# Patient Record
Sex: Female | Born: 1975 | Race: Black or African American | Hispanic: No | Marital: Single | Smoking: Current every day smoker
Health system: Southern US, Community
[De-identification: ages and names within clinical notes are randomized; demographics above are authoritative.]

## PROBLEM LIST (undated history)

## (undated) DIAGNOSIS — G8929 Other chronic pain: Secondary | ICD-10-CM

## (undated) DIAGNOSIS — E119 Type 2 diabetes mellitus without complications: Secondary | ICD-10-CM

## (undated) DIAGNOSIS — K219 Gastro-esophageal reflux disease without esophagitis: Secondary | ICD-10-CM

## (undated) DIAGNOSIS — M1990 Unspecified osteoarthritis, unspecified site: Secondary | ICD-10-CM

## (undated) DIAGNOSIS — M549 Dorsalgia, unspecified: Secondary | ICD-10-CM

## (undated) HISTORY — PX: CARPAL TUNNEL RELEASE: SHX101

## (undated) HISTORY — PX: KNEE SURGERY: SHX244

## (undated) HISTORY — PX: TUBAL LIGATION: SHX77

---

## 2005-09-24 ENCOUNTER — Ambulatory Visit (HOSPITAL_COMMUNITY): Admission: RE | Admit: 2005-09-24 | Discharge: 2005-09-24 | Payer: Self-pay | Admitting: Family Medicine

## 2005-10-05 ENCOUNTER — Emergency Department (HOSPITAL_COMMUNITY): Admission: EM | Admit: 2005-10-05 | Discharge: 2005-10-05 | Payer: Self-pay | Admitting: Emergency Medicine

## 2005-10-08 ENCOUNTER — Ambulatory Visit (HOSPITAL_COMMUNITY): Admission: RE | Admit: 2005-10-08 | Discharge: 2005-10-08 | Payer: Self-pay | Admitting: Family Medicine

## 2005-11-14 ENCOUNTER — Ambulatory Visit (HOSPITAL_COMMUNITY): Admission: AD | Admit: 2005-11-14 | Discharge: 2005-11-14 | Payer: Self-pay | Admitting: Obstetrics and Gynecology

## 2005-12-16 ENCOUNTER — Ambulatory Visit (HOSPITAL_COMMUNITY): Admission: AD | Admit: 2005-12-16 | Discharge: 2005-12-16 | Payer: Self-pay | Admitting: Obstetrics and Gynecology

## 2005-12-18 ENCOUNTER — Encounter: Payer: Self-pay | Admitting: Obstetrics and Gynecology

## 2005-12-18 ENCOUNTER — Inpatient Hospital Stay (HOSPITAL_COMMUNITY): Admission: AD | Admit: 2005-12-18 | Discharge: 2005-12-20 | Payer: Self-pay | Admitting: Obstetrics and Gynecology

## 2006-01-24 ENCOUNTER — Ambulatory Visit (HOSPITAL_COMMUNITY): Admission: RE | Admit: 2006-01-24 | Discharge: 2006-01-24 | Payer: Self-pay | Admitting: Obstetrics and Gynecology

## 2006-03-15 ENCOUNTER — Emergency Department (HOSPITAL_COMMUNITY): Admission: EM | Admit: 2006-03-15 | Discharge: 2006-03-15 | Payer: Self-pay | Admitting: Emergency Medicine

## 2006-10-18 DIAGNOSIS — G8929 Other chronic pain: Secondary | ICD-10-CM

## 2006-10-18 HISTORY — DX: Other chronic pain: G89.29

## 2008-07-01 ENCOUNTER — Encounter: Payer: Self-pay | Admitting: Family Medicine

## 2008-07-18 ENCOUNTER — Encounter: Payer: Self-pay | Admitting: Family Medicine

## 2008-09-26 ENCOUNTER — Emergency Department (HOSPITAL_COMMUNITY): Admission: EM | Admit: 2008-09-26 | Discharge: 2008-09-26 | Payer: Self-pay | Admitting: Emergency Medicine

## 2008-10-08 ENCOUNTER — Emergency Department (HOSPITAL_COMMUNITY): Admission: EM | Admit: 2008-10-08 | Discharge: 2008-10-08 | Payer: Self-pay | Admitting: Emergency Medicine

## 2008-10-16 ENCOUNTER — Ambulatory Visit (HOSPITAL_COMMUNITY): Admission: RE | Admit: 2008-10-16 | Discharge: 2008-10-16 | Payer: Self-pay | Admitting: Orthopaedic Surgery

## 2009-04-21 ENCOUNTER — Emergency Department (HOSPITAL_COMMUNITY): Admission: EM | Admit: 2009-04-21 | Discharge: 2009-04-21 | Payer: Self-pay | Admitting: Emergency Medicine

## 2009-05-18 ENCOUNTER — Emergency Department (HOSPITAL_COMMUNITY): Admission: EM | Admit: 2009-05-18 | Discharge: 2009-05-18 | Payer: Self-pay | Admitting: Emergency Medicine

## 2009-06-26 ENCOUNTER — Ambulatory Visit (HOSPITAL_COMMUNITY): Admission: RE | Admit: 2009-06-26 | Discharge: 2009-06-26 | Payer: Self-pay | Admitting: Orthopaedic Surgery

## 2009-11-20 ENCOUNTER — Emergency Department (HOSPITAL_COMMUNITY): Admission: EM | Admit: 2009-11-20 | Discharge: 2009-11-20 | Payer: Self-pay | Admitting: Emergency Medicine

## 2010-03-28 ENCOUNTER — Emergency Department (HOSPITAL_COMMUNITY): Admission: EM | Admit: 2010-03-28 | Discharge: 2010-03-28 | Payer: Self-pay | Admitting: Emergency Medicine

## 2010-04-27 ENCOUNTER — Emergency Department (HOSPITAL_COMMUNITY): Admission: EM | Admit: 2010-04-27 | Discharge: 2010-04-27 | Payer: Self-pay | Admitting: Emergency Medicine

## 2011-01-19 ENCOUNTER — Emergency Department (HOSPITAL_COMMUNITY): Payer: Self-pay

## 2011-01-19 ENCOUNTER — Emergency Department (HOSPITAL_COMMUNITY)
Admission: EM | Admit: 2011-01-19 | Discharge: 2011-01-19 | Disposition: A | Discharge status: Home / Self Care | Payer: Self-pay | Attending: Emergency Medicine | Admitting: Emergency Medicine

## 2011-01-19 DIAGNOSIS — J45909 Unspecified asthma, uncomplicated: Secondary | ICD-10-CM | POA: Insufficient documentation

## 2011-01-19 DIAGNOSIS — M25569 Pain in unspecified knee: Secondary | ICD-10-CM | POA: Insufficient documentation

## 2011-01-19 DIAGNOSIS — G8929 Other chronic pain: Secondary | ICD-10-CM | POA: Insufficient documentation

## 2011-01-22 LAB — DIFFERENTIAL
Eosinophils Relative: 4 % (ref 0–5)
Lymphocytes Relative: 21 % (ref 12–46)
Lymphs Abs: 2.4 10*3/uL (ref 0.7–4.0)
Monocytes Absolute: 0.8 10*3/uL (ref 0.1–1.0)

## 2011-01-22 LAB — COMPREHENSIVE METABOLIC PANEL
AST: 24 U/L (ref 0–37)
Albumin: 3.8 g/dL (ref 3.5–5.2)
Calcium: 9.5 mg/dL (ref 8.4–10.5)
Chloride: 102 mEq/L (ref 96–112)
Creatinine, Ser: 0.6 mg/dL (ref 0.4–1.2)
GFR calc Af Amer: >60 mL/min (ref >60)
Total Protein: 6.8 g/dL (ref 6.0–8.3)

## 2011-01-22 LAB — CBC
MCHC: 34.2 g/dL (ref 30.0–36.0)
MCV: 84.3 fL (ref 78.0–100.0)
Platelets: 199 10*3/uL (ref 150–400)
RDW: 14.5 % (ref 11.5–15.5)
WBC: 11.3 10*3/uL — ABNORMAL HIGH (ref 4.0–10.5)

## 2011-01-22 LAB — URINALYSIS, ROUTINE W REFLEX MICROSCOPIC
Bilirubin Urine: NEGATIVE
Glucose, UA: NEGATIVE mg/dL
Ketones, ur: NEGATIVE mg/dL
Leukocytes, UA: NEGATIVE
Specific Gravity, Urine: 1.03 (ref 1.005–1.030)
pH: 5.5 (ref 5.0–8.0)

## 2011-01-22 LAB — URINE MICROSCOPIC-ADD ON

## 2011-03-05 NOTE — H&P (Signed)
NAME:  Emily Simmons, Emily Simmons NO.:  [id number]   MEDICAL RECORD NO.:  [id number]          PATIENT TYPE:  AMB   LOCATION:  DAY                           FACILITY:  [facility]   PHYSICIAN:  Tilda Burrow, M.D. DATE OF BIRTH:  [birth year]   DATE OF ADMISSION:  DATE OF DISCHARGE:  LH                                HISTORY & PHYSICAL   ADMISSION DIAGNOSIS:  Desire for elective permanent sterilization.   HISTORY OF PRESENT ILLNESS:  This 35 year old gravida 5, para 4 at 37-1/[redacted]  weeks gestation is admitted for elective permanent sterilization after  pregnancy, followed at our office.  Patient has been seen here at [facility] for postpartum visit, confirmed for [place] again her desire for  permanent sterilization.  Failure rate of the procedure of 1/100 is quoted  to the patient.  Patient has no questions about the procedure, having had  instructional information given her at her hospital postpartum discharge.  She had been aware that we use a two puncture technique with a ring  application to the tube.   ALLERGIES:  NONE.   PAST MEDICAL HISTORY:  Benign.   PAST SURGICAL HISTORY:  Knee surgery many years ago under general  anesthesia.   HABITS:  Cigarettes three per day.  Recreational drugs, denied.   PHYSICAL EXAMINATION:  GENERAL APPEARANCE:  She is a healthy-appearing  African American female.  VITAL SIGNS:  Weight 206, blood pressure 108/70, pulse 70s.  HEENT:  Pupils are equal, round and reactive to light.  NECK:  Supple.  Normal thyroid.  CHEST:  Clear to auscultation.  BREAST:  Exam deferred.  CARDIOVASCULAR:  Regular rate and rhythm without murmurs.  ABDOMEN:  No evidence of hernia.  EXTERNAL GENITALIA:  Normal multiparous female.  VAGINAL:  Normal secretions with slight leukorrhea.  Wet prep is negative  for trichomonas, GC and Chlamydia cultures performed.  Adnexa negative.   SOCIAL HISTORY:  Patient has stable relationship with five children by the  same  partner and he declines consideration of vasectomy.   PLAN:  Laparoscopic tubal sterilization with Falope ring on January 24, 2006.      Tilda Burrow, M.D.  Electronically Signed     JVF/MEDQ  D:  01/17/2006  T:  01/18/2006  Job:  [id number]   cc:   [facility]   Short Stay Center [facility].

## 2011-03-05 NOTE — Op Note (Signed)
NAMEMI, BALLA NO.:  [id number]   MEDICAL RECORD NO.:  [id number]           PATIENT TYPE:  OIB   LOCATION:  LDR2                          FACILITY:  [facility]   PHYSICIAN:  Tilda Burrow, M.D. DATE OF BIRTH:  02-17-1976   DATE OF PROCEDURE:  11/14/2005  DATE OF DISCHARGE:  11/14/2005                                 OPERATIVE REPORT   Emily Simmons is seen in the emergency room for a right gluteal abscess.  This 35-  year-old female is pregnant, being followed at [facility] after  transfer from [facility] now has a due date of January 05, 2006.  She has had late prenatal care and has been seen in our office last  week for a gluteal abscess just anterior and superior to the anus with  drainage.  She was placed on oral antibiotics.  She had some drainage from a  small opening near the anus but exam shows a huge 10 cm long x 6 cm wide  area extending anterior along the base of the right labia majora.  There is  at least two sites that the fluctuance is near to the surface. These are  both a couple of centimeters anterior and lateral to the draining sinus that  is already present that seems to be partially obstructed.   After the patient's consent we prepped and cleansed the area and placed a  sealed block surrounding it using 30 mL of 1% lidocaine we were achieving  adequate analgesia to allow two separate stab incisions to be placed in the  areas of greatest fluctuance.  A huge, probably 100 mL of purulent material,  malodorous, suspected anaerobic in character is expressed from these two  sites.  We were able to block it on the side near the anus with 2% lidocaine  and an additional 10 mL; and after this were able to disrupt any loculations  using hemostats.  There is a communication between the two abscess openings.  After as much purulent debris can be expressed, has been removed, a 1/2-inch  iodoform wick is inserted in the anterior opening  and pulled through and  allow to exit through the other site. The patient tolerated the procedure  well and will continue antibiotics and followup in our office in 2 days for  reassessment.   DISCHARGE MEDICATIONS:  Cephalexin 500 mg t.i.d. prescribed by Dr. Lorenso Courier in  [place], and will be added some Vicodin for pain.   FOLLOWUP:  In our office.   ADDENDUM:  A nonstress test is obtained which shows a reactive fetal heart  rate testing.      Tilda Burrow, M.D.  Electronically Signed     JVF/MEDQ  D:  11/14/2005  T:  11/15/2005  Job:  [id number]

## 2011-03-05 NOTE — Op Note (Signed)
Emily Simmons, Emily Simmons NO.:  [id number]   MEDICAL RECORD NO.:  [id number]           PATIENT TYPE:  OBV   LOCATION:  A401                          FACILITY:  [facility]   PHYSICIAN:  Langley Gauss, MD     DATE OF BIRTH:  1976/08/30   DATE OF PROCEDURE:  DATE OF DISCHARGE:                                 OPERATIVE REPORT   DELIVERY NOTE   DIAGNOSES:  1.  A 37-1/2 week intrauterine pregnancy.  2.  History of previous rapid labor delivery.  3.  History of gestational diabetes with previous pregnancies; and also with      this pregnancy.   FINDINGS AT TIME OF DELIVERY:  The patient had spontaneous rupture of  membranes at advanced dilatation; was noted to have been meconium-stained  amniotic fluid.   DELIVERY PERFORMED:  Spontaneous assisted vaginal delivery of a 6 pounds 2  ounce female infant delivered over an intact perineum.   DELIVERY PERFORMED:  By Dr. Roylene Reason. Lisette Grinder.   ESTIMATED BLOOD LOSS:  Less than 500 mL.   COMPLICATIONS:  None.   SPECIMENS:  Arterial cord gas and cord blood.  The placenta is examined and  noted to have a true knot in the umbilical cord and is thus sent for  permanent pathology.   SUMMARY:  The patient presented, initially, at 7-8 cm dilated, intact  bulging membranes.  GBS carrier status negative. The patient subsequently  was in active labor. She did have a spontaneous rupture of membranes with  thin, green meconium stained amniotic fluid noted.  At complete dilatation,  she was placed in dorsolithotomy position, prepped and draped in the usual  sterile manner. A DeLee was hooked up to wall suction. The patient then  pushed well during short second stage of labor in the dorsolithotomy  position delivering in a direct OA position over an intact perineum. Mouth  and nares were DeLee suctioned. Spontaneous rotation then occurred to a  right anterior shoulder position. Gentle downward traction combined with  expulsive efforts  resulted in delivery of this anterior shoulder as well as  the remainder of the infant without difficulty. DeLee suctioning was, again,  performed following delivery. A spontaneous cry is noted. Umbilical cord is  milked toward the infant cord.  The cord is doubly clamped and cut; and the  infant is sent to the awaiting nursing staff for assessment. Arterial cord  gas and cord blood were then obtained. Gentle traction on the umbilical cord  results in separation, which upon examination, appears to be an  intact placenta associated with a 3-vessel umbilical cord. Excellent uterine  tone was achieved following delivery. Examination of the genital tract  reveals no lacerations, the peritoneum itself was intact. The patient is  taken out of the dorsal lithotomy position and allowed to bond with the  infant.      Langley Gauss, MD  Electronically Signed     DC/MEDQ  D:  12/18/2005  T:  12/19/2005  Job:  [id number]

## 2011-03-05 NOTE — Op Note (Signed)
NAMEJAYLEIGH, Emily Simmons                 ACCOUNT NO.:  [id number]   MEDICAL RECORD NO.:  [id number]          PATIENT TYPE:  AMB   LOCATION:  DAY                           FACILITY:  [facility]   PHYSICIAN:  Tilda Burrow, M.D. DATE OF BIRTH:  1976-03-02   DATE OF PROCEDURE:  01/24/2006  DATE OF DISCHARGE:                                 OPERATIVE REPORT   PREOPERATIVE DIAGNOSIS:  Elective sterilization.   POSTOPERATIVE DIAGNOSIS:  Elective sterilization.   PROCEDURE:  Laparoscopic tubal sterilization, Falope ring.   SURGEON:  Tilda Burrow, M.D.   ASSISTANT:  None.   ANESTHESIA:  General anesthesia.   COMPLICATIONS:  None.   FINDINGS:  Thick left tube requiring two efforts to successfully place the  ring on the left tube.  Otherwise standard technique used.   ESTIMATED BLOOD LOSS:  Minimal.   DETAILS OF PROCEDURE:  The patient was taken to the operating room, prepped  and draped in the usual standard fashion with legs in low lithotomy leg  supports after general anesthesia was introduced without difficulty.  The  bladder was in-and-out catheterized and Hulka tenaculum attached to the  cervix for uterine  manipulation.  An infraumbilical, vertical, 1-cm, skin  incision was made as well as a transverse suprapubic 1-cm incision. A Veress  needle was used to achieve pneumoperitoneum through the umbilical incision  while being careful to orient the needle toward the pelvis while elevating  the abdominal wall by manual elevation. Water droplet test was used to  confirm intraperitoneal placement.   Pneumoperitoneum was achieved easily under 8-to-10 mm of intra-abdominal  pressure; and the laparoscopic trocar was introduced, a 5-mm blunt tipped  trocar, under direct visualization using the video camera.  Peritoneal  cavity was entered without difficulty.  Inspection of the anterior surfaces  of the abdominal contents showed no evidence of injury or bleeding.  Attention was directed to  the pelvis.  Findings were as described above.   Attention was first directed to the left fallopian tube which was elevated,  identified to its fimbriated end and grasped in its mid portion with Falope  ring applier.  Falope ring applied and then the tube infiltrated with  Marcaine solution 0.25% using a 22-gauge spinal needle percutaneously  applied.   Attention was then directed to the right fallopian tube where a similar  procedure was performed.  Photo documentation of the ring placements was  performed; 120 cc of saline was instilled into the abdomen; deflation of  CO2 performed; instruments removed and subcuticular 4-0 Dexon closure of  skin incisions performed.  The rest of the surgical instruments were  removed; Steri-Strips placed.  The patient allowed to awaken and go to  recovery room in standard fashion.      Tilda Burrow, M.D.  Electronically Signed     JVF/MEDQ  D:  01/24/2006  T:  01/24/2006  Job:  [id number]   cc:   [facility]

## 2011-03-05 NOTE — H&P (Signed)
Emily Simmons, KOLK NO.:  [id number]   MEDICAL RECORD NO.:  [id number]          PATIENT TYPE:  INP   LOCATION:  LDR2                          FACILITY:  [facility]   PHYSICIAN:  Langley Gauss, MD     DATE OF BIRTH:  [birth year]   DATE OF ADMISSION:  12/18/2005  DATE OF DISCHARGE:  LH                                HISTORY & PHYSICAL   A 35 year old gravida 5, para 4 at 37-1/[redacted] weeks gestation who presents to  [facility] at about 1800 Hr. complaining of onset of uterine  contractions at 1400 today. On initial presentation she is noted to be 7-8  cm dilated with intact bulging membranes.   PRENATAL COURSE HAS BEEN COMPLICATED BY:  Late prenatal care. Ultrasound on  October 08, 2005 revealed oligohydramnios with an AFI of 8.8, however this  subsequently was repeated the following month and noted to be within the  normal range at 13.4. The patient also has experienced gestational diabetes.  She does do fasting blood sugars and 2-hour postprandial's at home which  have been within normal limits. She has not required initiation of insulin.  She has had twice weekly non stress-tests. Last non stress-test was done 2  days ago and noted to be reactive. 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 Type 2 was negative. Hepatitis B was  negative. GBS carrier status negative.   OBSTETRIC HISTORY:  Previous vaginal delivery x4. She has had gestational  diabetes with 3 of the pregnancies. She does have a history of a preterm  labor and delivery on December 23, 2004 delivering at 34-weeks gestation.   PAST MEDICAL HISTORY:  No medical or surgical history.   SOCIAL HISTORY:  She does smoke about 3 cigarettes a day. She has no prior  hospitalizations.   ALLERGIES:  No known drug allergies.   CURRENT MEDICATIONS:  Prenatal vitamins and iron.   PHYSICAL EXAMINATION:  GENERAL:  A black female weighing 216 pounds.  VITAL SIGNS:  Blood pressure  is 100/60, pulse rate 80, respiratory rate is  20.  HEENT:  Negative.  NECK:  No adenopathy. Neck is supple. Thyroid is non palpable.  LUNGS:  Clear.  CARDIOVASCULAR:  Regular rate and rhythm.  ABDOMEN:  Soft and nontender. Vertex presentation by Leopold's maneuver.  Morbidly obese.  Fundal height is 40 cm.  EXTREMITIES:  Normal.  PELVIC EXAM:  Initial examination per nursing staff reveals 7-8 cm, intact  bulging membranes, contracting q.3-5 minutes.   ASSESSMENT:  1.  37-1/2 weeks intrauterine pregnancy in active labor.  2.  History of a prior rapid labor and delivery with short second stage.   Preparations are made at this time for near eminent delivery.      Langley Gauss, MD  Electronically Signed     DC/MEDQ  D:  12/18/2005  T:  12/18/2005  Job:  [id number]

## 2012-04-03 ENCOUNTER — Encounter (HOSPITAL_COMMUNITY): Payer: Self-pay | Admitting: *Deleted

## 2012-04-03 ENCOUNTER — Emergency Department (HOSPITAL_COMMUNITY)
Admission: EM | Admit: 2012-04-03 | Discharge: 2012-04-03 | Disposition: A | Discharge status: Home / Self Care | Payer: Self-pay | Attending: Emergency Medicine | Admitting: Emergency Medicine

## 2012-04-03 DIAGNOSIS — L02412 Cutaneous abscess of left axilla: Secondary | ICD-10-CM

## 2012-04-03 DIAGNOSIS — IMO0002 Reserved for concepts with insufficient information to code with codable children: Secondary | ICD-10-CM | POA: Insufficient documentation

## 2012-04-03 MED ORDER — HYDROCODONE-ACETAMINOPHEN 5-325 MG PO TABS: 1.0000 | ORAL_TABLET | ORAL | Status: AC | PRN

## 2012-04-03 MED ORDER — LIDOCAINE HCL (PF) 1 % IJ SOLN
INTRAMUSCULAR | Status: AC
  Filled 2012-04-03: qty 10

## 2012-04-03 MED ORDER — DOXYCYCLINE HYCLATE 100 MG PO CAPS: 100.0000 mg | ORAL_CAPSULE | Freq: Two times a day (BID) | ORAL | Status: AC

## 2012-04-03 MED ORDER — LIDOCAINE HCL (PF) 2 % IJ SOLN
10.0000 mL | Freq: Once | INTRAMUSCULAR | Status: AC
  Administered 2012-04-03: 10 mL
  Filled 2012-04-03: qty 10

## 2012-04-03 NOTE — ED Notes (Signed)
Lt axilla abscess.  No d/c

## 2012-04-03 NOTE — Discharge Instructions (Signed)
Abscess An abscess (boil or furuncle) is an infected area that contains a collection of pus.  SYMPTOMS Signs and symptoms of an abscess include pain, tenderness, redness, or hardness. You may feel a moveable soft area under your skin. An abscess can occur anywhere in the body.  TREATMENT  A surgical cut (incision) may be made over your abscess to drain the pus. Gauze may be packed into the space or a drain may be looped through the abscess cavity (pocket). This provides a drain that will allow the cavity to heal from the inside outwards. The abscess may be painful for a few days, but should feel much better if it was drained.  Your abscess, if seen early, may not have localized and may not have been drained. If not, another appointment may be required if it does not get better on its own or with medications. HOME CARE INSTRUCTIONS   Only take over-the-counter or prescription medicines for pain, discomfort, or fever as directed by your caregiver.   Take your antibiotics as directed if they were prescribed. Finish them even if you start to feel better.   Keep the skin and clothes clean around your abscess.   If the abscess was drained, you will need to use gauze dressing to collect any draining pus. Dressings will typically need to be changed 3 or more times a day.   The infection may spread by skin contact with others. Avoid skin contact as much as possible.   Practice good hygiene. This includes regular hand washing, cover any draining skin lesions, and do not share personal care items.   If you participate in sports, do not share athletic equipment, towels, whirlpools, or personal care items. Shower after every practice or tournament.   If a draining area cannot be adequately covered:   Do not participate in sports.   Children should not participate in day care until the wound has healed or drainage stops.   If your caregiver has given you a follow-up appointment, it is very important  to keep that appointment. Not keeping the appointment could result in a much worse infection, chronic or permanent injury, pain, and disability. If there is any problem keeping the appointment, you must call back to this facility for assistance.  SEEK MEDICAL CARE IF:   You develop increased pain, swelling, redness, drainage, or bleeding in the wound site.   You develop signs of generalized infection including muscle aches, chills, fever, or a general ill feeling.   You have an oral temperature above 102 F (38.9 C).  MAKE SURE YOU:   Understand these instructions.   Will watch your condition.   Will get help right away if you are not doing well or get worse.  Document Released: 07/14/2005 Document Revised: 09/23/2011 Document Reviewed: 05/07/2008 [facility] Patient Information 2012 [place], [place].   You may take the hydrocodone prescribed for pain relief.  This will make you drowsy - do not drive within 4 hours of taking this medication.

## 2012-04-03 NOTE — ED Notes (Signed)
PA in with pt 

## 2012-04-04 NOTE — ED Provider Notes (Signed)
Medical screening examination/treatment/procedure(s) were performed by non-physician practitioner and as supervising physician I was immediately available for consultation/collaboration.   Laray Anger, DO 04/04/12 1101

## 2012-04-04 NOTE — ED Provider Notes (Signed)
History     CSN: [id number]  Arrival date & time 04/03/12  1211   First MD Initiated Contact with Patient 04/03/12 1248      Chief Complaint  Patient presents with  . Abscess    (Consider location/radiation/quality/duration/timing/severity/associated sxs/prior treatment) HPI Comments: Emily Simmons presents with a two-day history of pain and swelling in her left axilla.  She does have a history of infrequent abscesses the last one occurring over one year ago in her left groin which was I&D.  She denies fevers or chills, but has exquisite pain at her left axilla and is currently unable to lower her left arm secondary to pain.  She's had no nausea or vomiting.  She has used warm compresses without relief, there has been no drainage from the site.pain is constant and severe and described as sharp.  The history is provided by the patient.    History reviewed. No pertinent past medical history.  Past Surgical History  Procedure Date  . Knee surgery   . Hand surgery   . Tubal ligation     Family History  Problem Relation Age of Onset  . Diabetes Mother   . Hypertension Mother   . Asthma Mother   . Diabetes Father   . Asthma Father   . Hypertension Sister     History  Substance Use Topics  . Smoking status: Current Everyday Smoker  . Smokeless tobacco: Not on file  . Alcohol Use: Yes    OB History    Grav Para Term Preterm Abortions TAB SAB Ect Mult Living                  Review of Systems  Constitutional: Negative for fever and chills.  Respiratory: Negative for shortness of breath and wheezing.   Skin: Positive for color change and wound.  Neurological: Negative for numbness.    Allergies  Review of patient's allergies indicates no known allergies.  Home Medications   Current Outpatient Rx  Name Route Sig Dispense Refill  . IBUPROFEN 200 MG PO TABS Oral Take 800 mg by mouth every 6 (six) hours as needed. Pain    . DOXYCYCLINE HYCLATE 100 MG PO CAPS Oral  Take 1 capsule (100 mg total) by mouth 2 (two) times daily. 20 capsule 0  . HYDROCODONE-ACETAMINOPHEN 5-325 MG PO TABS Oral Take 1 tablet by mouth every 4 (four) hours as needed for pain. 20 tablet 0    BP 128/85  Pulse 92  Temp 98.3 F (36.8 C) (Oral)  Resp 20  Ht 5\' 8"  (1.727 m)  Wt 226 lb (102.513 kg)  BMI 34.36 kg/m2  SpO2 100%  LMP 04/03/2012  Physical Exam  Constitutional: She is oriented to person, place, and time. She appears well-developed and well-nourished.  HENT:  Head: Normocephalic.  Cardiovascular: Normal rate.   Pulmonary/Chest: Effort normal.  Neurological: She is alert and oriented to person, place, and time. No sensory deficit.  Skin:       Very tender and indurated abscess without pointing or fluctuance in left axilla.  Palpable abscess is 4 cm x 2 cm width of 1 cm surrounding area of erythema.  There is no red streaking from this infection site.    ED Course  Procedures (including critical care time)  Labs Reviewed - No data to display No results found.   1. Abscess of left axilla     Discussed various treatments including continued warm compresses and antibiotics versus I&D today which patient  prefers.  INCISION AND DRAINAGE Performed by: Burgess Amor Consent: Verbal consent obtained. Risks and benefits: risks, benefits and alternatives were discussed Type: abscess  Body area: Left axilla  Anesthesia: local infiltration  Local anesthetic: lidocaine 2%without epinephrine  Anesthetic total: 4 ml  Complexity: complex Blunt dissection to break up loculations  Drainage: purulent  Drainage amount: moderate  Packing material: 1/2 in iodoform gauze  Patient tolerance: Patient tolerated the procedure well with no immediate complications.     MDM  Patient return in 3 days for packing removal.  She was prescribed doxycycline and also prescribed hydrocodone for pain relief when necessary.        Burgess Amor, [place] 04/04/12 [phone]

## 2013-04-26 ENCOUNTER — Other Ambulatory Visit (HOSPITAL_COMMUNITY): Payer: Self-pay

## 2013-05-02 ENCOUNTER — Ambulatory Visit: Admit: 2013-05-02 | Payer: Self-pay | Admitting: General Surgery

## 2013-05-02 SURGERY — EXCISION, HIDRADENITIS, AXILLA
Anesthesia: General | Laterality: Left

## 2014-02-13 ENCOUNTER — Encounter (HOSPITAL_COMMUNITY): Payer: Self-pay | Admitting: Emergency Medicine

## 2014-02-13 ENCOUNTER — Emergency Department (HOSPITAL_COMMUNITY)
Admission: EM | Admit: 2014-02-13 | Discharge: 2014-02-13 | Disposition: A | Discharge status: Home / Self Care | Payer: Self-pay | Attending: Emergency Medicine | Admitting: Emergency Medicine

## 2014-02-13 DIAGNOSIS — F17200 Nicotine dependence, unspecified, uncomplicated: Secondary | ICD-10-CM | POA: Insufficient documentation

## 2014-02-13 DIAGNOSIS — Z79899 Other long term (current) drug therapy: Secondary | ICD-10-CM | POA: Insufficient documentation

## 2014-02-13 DIAGNOSIS — E119 Type 2 diabetes mellitus without complications: Secondary | ICD-10-CM | POA: Insufficient documentation

## 2014-02-13 DIAGNOSIS — M5432 Sciatica, left side: Secondary | ICD-10-CM

## 2014-02-13 DIAGNOSIS — M5430 Sciatica, unspecified side: Secondary | ICD-10-CM | POA: Insufficient documentation

## 2014-02-13 HISTORY — DX: Type 2 diabetes mellitus without complications: E11.9

## 2014-02-13 LAB — CBG MONITORING, ED: Glucose-Capillary: 110 mg/dL — ABNORMAL HIGH (ref 70–99)

## 2014-02-13 MED ORDER — IBUPROFEN 600 MG PO TABS: 600.0000 mg | ORAL_TABLET | Freq: Four times a day (QID) | ORAL | Status: DC | PRN

## 2014-02-13 MED ORDER — CYCLOBENZAPRINE HCL 5 MG PO TABS: 5.0000 mg | ORAL_TABLET | Freq: Three times a day (TID) | ORAL | Status: DC | PRN

## 2014-02-13 MED ORDER — HYDROCODONE-ACETAMINOPHEN 5-325 MG PO TABS: 1.0000 | ORAL_TABLET | ORAL | Status: DC | PRN

## 2014-02-13 NOTE — Discharge Instructions (Signed)
Sciatica °Sciatica is pain, weakness, numbness, or tingling along the path of the sciatic nerve. The nerve starts in the lower back and runs down the back of each leg. The nerve controls the muscles in the lower leg and in the back of the knee, while also providing sensation to the back of the thigh, lower leg, and the sole of your foot. Sciatica is a symptom of another medical condition. For instance, nerve damage or certain conditions, such as a herniated disk or bone spur on the spine, pinch or put pressure on the sciatic nerve. This causes the pain, weakness, or other sensations normally associated with sciatica. Generally, sciatica only affects one side of the body. °CAUSES  °· Herniated or slipped disc. °· Degenerative disk disease. °· A pain disorder involving the narrow muscle in the buttocks (piriformis syndrome). °· Pelvic injury or fracture. °· Pregnancy. °· Tumor (rare). °SYMPTOMS  °Symptoms can vary from mild to very severe. The symptoms usually travel from the low back to the buttocks and down the back of the leg. Symptoms can include: °· Mild tingling or dull aches in the lower back, leg, or hip. °· Numbness in the back of the calf or sole of the foot. °· Burning sensations in the lower back, leg, or hip. °· Sharp pains in the lower back, leg, or hip. °· Leg weakness. °· Severe back pain inhibiting movement. °These symptoms may get worse with coughing, sneezing, laughing, or prolonged sitting or standing. Also, being overweight may worsen symptoms. °DIAGNOSIS  °Your caregiver will perform a physical exam to look for common symptoms of sciatica. He or she may ask you to do certain movements or activities that would trigger sciatic nerve pain. Other tests may be performed to find the cause of the sciatica. These may include: °· Blood tests. °· X-rays. °· Imaging tests, such as an MRI or CT scan. °TREATMENT  °Treatment is directed at the cause of the sciatic pain. Sometimes, treatment is not necessary  and the pain and discomfort goes away on its own. If treatment is needed, your caregiver may suggest: °· Over-the-counter medicines to relieve pain. °· Prescription medicines, such as anti-inflammatory medicine, muscle relaxants, or narcotics. °· Applying heat or ice to the painful area. °· Steroid injections to lessen pain, irritation, and inflammation around the nerve. °· Reducing activity during periods of pain. °· Exercising and stretching to strengthen your abdomen and improve flexibility of your spine. Your caregiver may suggest losing weight if the extra weight makes the back pain worse. °· Physical therapy. °· Surgery to eliminate what is pressing or pinching the nerve, such as a bone spur or part of a herniated disk. °HOME CARE INSTRUCTIONS  °· Only take over-the-counter or prescription medicines for pain or discomfort as directed by your caregiver. °· Apply ice to the affected area for 20 minutes, 3 4 times a day for the first 48 72 hours. Then try heat in the same way. °· Exercise, stretch, or perform your usual activities if these do not aggravate your pain. °· Attend physical therapy sessions as directed by your caregiver. °· Keep all follow-up appointments as directed by your caregiver. °· Do not wear high heels or shoes that do not provide proper support. °· Check your mattress to see if it is too soft. A firm mattress may lessen your pain and discomfort. °SEEK IMMEDIATE MEDICAL CARE IF:  °· You lose control of your bowel or bladder (incontinence). °· You have increasing weakness in the lower back,   pelvis, buttocks, or legs.  You have redness or swelling of your back.  You have a burning sensation when you urinate.  You have pain that gets worse when you lie down or awakens you at night.  Your pain is worse than you have experienced in the past.  Your pain is lasting longer than 4 weeks.  You are suddenly losing weight without reason. MAKE SURE YOU:  Understand these  instructions.  Will watch your condition.  Will get help right away if you are not doing well or get worse. Document Released: 09/28/2001 Document Revised: 04/04/2012 Document Reviewed: 02/13/2012 [facility] Patient Information 2014 [place].   Take the medicines as directed.  Do not drive within 4 hours of taking hydrocodone as this will make you drowsy.  Avoid lifting,  Bending,  Twisting or any other activity that worsens your pain over the next week.  Apply a heating pad to your lower back for 10-15 minutes several times daily for the next 2 days.  You should get rechecked if your symptoms are not better over the next 5 days,  Or you develop increased pain,  Weakness in your leg(s) or loss of bladder or bowel function - these are symptoms of a worse injury.

## 2014-02-13 NOTE — ED Notes (Signed)
Pt with pain to left hip that radiates down left thigh, pt states left foot does go numb most of time, hx of same due to an old fall/injury and states dx with "pinched nerve"

## 2014-02-13 NOTE — ED Notes (Signed)
Pain radiating down left leg x 3 days.  Denies injury.

## 2014-02-13 NOTE — ED Provider Notes (Signed)
CSN: [id number]     Arrival date & time 02/13/14  1809 History   First MD Initiated Contact with Patient 02/13/14 1844     Chief Complaint  Patient presents with  . Leg Pain     (Consider location/radiation/quality/duration/timing/severity/associated sxs/prior Treatment) The history is provided by the patient.   Emily Simmons is a 38 y.o. female presenting with a now 3 day history of left lower back and buttock pain which radiates into her upper posterior and lateral thigh.  Her pain is constant,  Throbbing and aching and worsened with movement and certain positions.  She is unable to stand up straight without worsened pain.  She denies any injury, but does report having mild intermittent back pain but usually is better after 1-2 doses of ibuprofen which has not helped this time.  She denies fevers or chills, weakness in her lower extremities and has had no loss of control of bowel or bladder.  She has no history of cancer or IV drug use.       Past Medical History  Diagnosis Date  . Diabetes mellitus without complication    Past Surgical History  Procedure Laterality Date  . Knee surgery    . Hand surgery    . Tubal ligation     Family History  Problem Relation Age of Onset  . Diabetes Mother   . Hypertension Mother   . Asthma Mother   . Diabetes Father   . Asthma Father   . Hypertension Sister    History  Substance Use Topics  . Smoking status: Current Every Day Smoker    Types: Cigarettes  . Smokeless tobacco: Not on file  . Alcohol Use: Yes     Comment: occ   OB History   Grav Para Term Preterm Abortions TAB SAB Ect Mult Living                 Review of Systems  Constitutional: Negative for fever.  Respiratory: Negative for shortness of breath.   Cardiovascular: Negative for chest pain and leg swelling.  Gastrointestinal: Negative for abdominal pain, constipation and abdominal distention.  Genitourinary: Negative for dysuria, urgency, frequency, flank pain  and difficulty urinating.  Musculoskeletal: Positive for back pain. Negative for gait problem and joint swelling.  Skin: Negative for rash.  Neurological: Negative for weakness and numbness.      Allergies  Review of patient's allergies indicates no known allergies.  Home Medications   Prior to Admission medications   Medication Sig Start Date End Date Taking? Authorizing Provider  cyclobenzaprine (FLEXERIL) 5 MG tablet Take 1 tablet (5 mg total) by mouth 3 (three) times daily as needed for muscle spasms. 02/13/14   Burgess AmorJulie Beren Yniguez, PA-C  HYDROcodone-acetaminophen (NORCO/VICODIN) 5-325 MG per tablet Take 1 tablet by mouth every 4 (four) hours as needed. 02/13/14   Burgess AmorJulie Rozanne Heumann, PA-C  ibuprofen (ADVIL,MOTRIN) 200 MG tablet Take 800 mg by mouth every 6 (six) hours as needed. Pain    Historical Provider, MD  ibuprofen (ADVIL,MOTRIN) 600 MG tablet Take 1 tablet (600 mg total) by mouth every 6 (six) hours as needed. 02/13/14   Burgess AmorJulie Luisana Lutzke, PA-C   BP 131/85  Pulse 97  Temp(Src) 98.2 F (36.8 C) (Oral)  Resp 16  Ht 5\' 8"  (1.727 m)  Wt 223 lb 3 oz (101.237 kg)  BMI 33.94 kg/m2  SpO2 98%  LMP 02/06/2014 Physical Exam  Nursing note and vitals reviewed. Constitutional: She appears well-developed and well-nourished.  HENT:  Head:  Normocephalic.  Eyes: Conjunctivae are normal.  Neck: Normal range of motion. Neck supple.  Cardiovascular: Normal rate and intact distal pulses.   Pedal pulses normal.  Pulmonary/Chest: Effort normal.  Abdominal: Soft. Bowel sounds are normal. She exhibits no distension and no mass.  Musculoskeletal: Normal range of motion. She exhibits tenderness. She exhibits no edema.       Lumbar back: She exhibits tenderness. She exhibits no swelling, no edema and no spasm.  Left SI joint tenderness with palpation.  Neurological: She is alert. She has normal strength. She displays no atrophy and no tremor. No sensory deficit. Gait normal.  Reflex Scores:      Patellar reflexes  are 2+ on the right side and 2+ on the left side.      Achilles reflexes are 2+ on the right side and 2+ on the left side. No strength deficit noted in hip and knee flexor and extensor muscle groups.  Ankle flexion and extension intact.  Skin: Skin is warm and dry.  Psychiatric: She has a normal mood and affect.    ED Course  Procedures (including critical care time) Labs Review Labs Reviewed  CBG MONITORING, ED - Abnormal; Notable for the following:    Glucose-Capillary 110 (*)    All other components within normal limits    Imaging Review No results found.   EKG Interpretation None      MDM   Final diagnoses:  Sciatica of left side    Patient was prescribed hydrocodone, Flexeril and ibuprofen.  She was encouraged to use heat therapy several times daily for the next 3-4 days.  Minimize activities that worsens her symptoms.  She was advised to followup with her PCP for recheck of her symptoms are not improved over the next week.  [facility]  No neuro deficit on exam or by history to suggest emergent or surgical presentation.  Also discussed worsened sx that should prompt immediate re-evaluation including distal weakness, bowel/bladder retention/incontinence.          Burgess AmorJulie Tabithia Stroder, PA-C 02/13/14 1958

## 2014-02-14 NOTE — ED Provider Notes (Signed)
Medical screening examination/treatment/procedure(s) were performed by non-physician practitioner and as supervising physician I was immediately available for consultation/collaboration.   EKG Interpretation None      Suheyla Mortellaro, MD, FACEP   Marland Reine L Simya Tercero, MD 02/14/14 0026 

## 2014-07-15 ENCOUNTER — Emergency Department (HOSPITAL_COMMUNITY): Payer: BC Managed Care – PPO

## 2014-07-15 ENCOUNTER — Encounter (HOSPITAL_COMMUNITY): Payer: Self-pay | Admitting: Emergency Medicine

## 2014-07-15 ENCOUNTER — Emergency Department (HOSPITAL_COMMUNITY)
Admission: EM | Admit: 2014-07-15 | Discharge: 2014-07-15 | Disposition: A | Discharge status: Home / Self Care | Payer: BC Managed Care – PPO | Attending: Emergency Medicine | Admitting: Emergency Medicine

## 2014-07-15 DIAGNOSIS — Z79899 Other long term (current) drug therapy: Secondary | ICD-10-CM | POA: Insufficient documentation

## 2014-07-15 DIAGNOSIS — M545 Low back pain, unspecified: Secondary | ICD-10-CM | POA: Diagnosis present

## 2014-07-15 DIAGNOSIS — F17200 Nicotine dependence, unspecified, uncomplicated: Secondary | ICD-10-CM | POA: Diagnosis not present

## 2014-07-15 DIAGNOSIS — M5416 Radiculopathy, lumbar region: Secondary | ICD-10-CM

## 2014-07-15 DIAGNOSIS — E119 Type 2 diabetes mellitus without complications: Secondary | ICD-10-CM | POA: Insufficient documentation

## 2014-07-15 DIAGNOSIS — IMO0002 Reserved for concepts with insufficient information to code with codable children: Secondary | ICD-10-CM | POA: Diagnosis not present

## 2014-07-15 MED ORDER — HYDROCODONE-ACETAMINOPHEN 5-325 MG PO TABS
1.0000 | ORAL_TABLET | Freq: Once | ORAL | Status: AC
  Administered 2014-07-15: 1 via ORAL
  Filled 2014-07-15: qty 1

## 2014-07-15 MED ORDER — HYDROCODONE-ACETAMINOPHEN 5-325 MG PO TABS: 1.0000 | ORAL_TABLET | ORAL | Status: DC | PRN

## 2014-07-15 NOTE — ED Notes (Signed)
C/o lower back pain with no known cause.

## 2014-07-15 NOTE — Discharge Instructions (Signed)
Lumbosacral Radiculopathy Lumbosacral radiculopathy is a pinched nerve or nerves in the low back (lumbosacral area). When this happens you may have weakness in your legs and may not be able to stand on your toes. You may have pain going down into your legs. There may be difficulties with walking normally. There are many causes of this problem. Sometimes this may happen from an injury, or simply from arthritis or boney problems. It may also be caused by other illnesses such as diabetes. If there is no improvement after treatment, further studies may be done to find the exact cause. DIAGNOSIS  X-rays may be needed if the problems become long standing. Electromyograms may be done. This study is one in which the working of nerves and muscles is studied. HOME CARE INSTRUCTIONS   Applications of ice packs may be helpful. Ice can be used in a plastic bag with a towel around it to prevent frostbite to skin. This may be used every 2 hours for 20 to 30 minutes, or as needed, while awake, or as directed by your caregiver.  Only take over-the-counter or prescription medicines for pain, discomfort, or fever as directed by your caregiver.  If physical therapy was prescribed, follow your caregiver's directions. SEEK IMMEDIATE MEDICAL CARE IF:   You have pain not controlled with medications.  You seem to be getting worse rather than better.  You develop increasing weakness in your legs.  You develop loss of bowel or bladder control.  You have difficulty with walking or balance, or develop clumsiness in the use of your legs.  You have a fever. MAKE SURE YOU:   Understand these instructions.  Will watch your condition.  Will get help right away if you are not doing well or get worse. Document Released: 10/04/2005 Document Revised: 12/27/2011 Document Reviewed: 05/24/2008 [facility] Patient Information 2015 [place], [place]. This information is not intended to replace advice given to you by your health  care provider. Make sure you discuss any questions you have with your health care provider.   Only take 3 of your ibuprofen tablets for total of 600 mg every 6 hours for back pain relief.  Add the prescription given for additional pain relief.  Do not drive within 4 hours of taking hydrocodone as this will make you drowsy.  Avoid lifting,  Bending,  Twisting or any other activity that worsens your pain over the next week.  Apply an  icepack  to your lower back for 10-15 minutes every 2 hours for the next 2 days.  You should get rechecked if your symptoms are not better over the next 5 days,  Or you develop increased pain,  Weakness in your leg(s) or loss of bladder or bowel function - these are symptoms of a worse injury.  You may need further testing as discussed if your symptoms persist.  Please followup with your primary doctor to discuss this possibility.

## 2014-07-15 NOTE — ED Provider Notes (Signed)
CSN: [id number]     Arrival date & time 07/15/14  1007 History   First MD Initiated Contact with Patient 07/15/14 1055    This chart was scribed for non-physician practitioner Burgess Amor, PA-C, working with Joya Gaskins, MD by Gwenevere Abbot, ED scribe. This patient was seen in room APFT23/APFT23 and the patient's care was started at 11:30 AM.  Chief Complaint  Patient presents with  . Back Pain   The history is provided by the patient. No language interpreter was used.   HPI Comments:  Emily Simmons is a 38 y.o. female who presents to the Emergency Department complaining of lower back pain. Pt reports that she was seen for lower back pain in April for solely the left side.The pain currently is in the lower back and radiates bilaterally into her legs. Pt does a lot of forward bending at her job, but denies lifting. The pain came on suddenly while she was working yesterday. Pt reports that sitting and applying pressure improves the level of pain and activity worsens pain. Pt took ibuprofen 1000 mg, without relief this morning. She describes intermittent sharp pain which causes her knees to nearly collapse if standing, but denies weakness in her legs. Pt denies incontinence of urine or bowel, denies saddle anesthesia.  Past Medical History  Diagnosis Date  . Diabetes mellitus without complication    Past Surgical History  Procedure Laterality Date  . Knee surgery    . Hand surgery    . Tubal ligation     Family History  Problem Relation Age of Onset  . Diabetes Mother   . Hypertension Mother   . Asthma Mother   . Diabetes Father   . Asthma Father   . Hypertension Sister    History  Substance Use Topics  . Smoking status: Current Every Day Smoker    Types: Cigarettes  . Smokeless tobacco: Not on file  . Alcohol Use: Yes     Comment: occ   OB History   Grav Para Term Preterm Abortions TAB SAB Ect Mult Living                 Review of Systems  Constitutional: Negative for  fever.  Respiratory: Negative for shortness of breath.   Cardiovascular: Negative for chest pain and leg swelling.  Gastrointestinal: Negative for abdominal pain, constipation and abdominal distention.  Genitourinary: Negative for dysuria, urgency, frequency, flank pain and difficulty urinating.  Musculoskeletal: Positive for back pain. Negative for gait problem and joint swelling.  Skin: Negative for rash.  Neurological: Negative for weakness and numbness.    Allergies  Review of patient's allergies indicates no known allergies.  Home Medications   Prior to Admission medications   Medication Sig Start Date End Date Taking? Authorizing Provider  GABAPENTIN PO Take 1 capsule by mouth 2 (two) times daily.   Yes Historical Provider, MD  ibuprofen (ADVIL,MOTRIN) 200 MG tablet Take 800 mg by mouth every 6 (six) hours as needed for moderate pain.   Yes Historical Provider, MD  HYDROcodone-acetaminophen (NORCO/VICODIN) 5-325 MG per tablet Take 1 tablet by mouth every 4 (four) hours as needed. 07/15/14   Burgess Amor, PA-C   BP 145/91  Pulse 78  Temp(Src) 98.9 F (37.2 C) (Oral)  Resp 16  Wt 225 lb (102.059 kg)  SpO2 100%  LMP 07/07/2014 Physical Exam  Nursing note and vitals reviewed. Constitutional: She appears well-developed and well-nourished.  HENT:  Head: Normocephalic.  Eyes: Conjunctivae are normal.  Neck: Normal range of motion. Neck supple.  Cardiovascular: Normal rate and intact distal pulses.   Pedal pulses normal.  Pulmonary/Chest: Effort normal.  Abdominal: Soft. Bowel sounds are normal. She exhibits no distension and no mass.  Musculoskeletal: Normal range of motion. She exhibits no edema.       Lumbar back: She exhibits tenderness. She exhibits no swelling, no edema and no spasm.  Midline and paralumbar ttp.  No edema, no deformity.  Neurological: She is alert. She has normal strength. She displays no atrophy and no tremor. No sensory deficit. Gait normal.  Reflex  Scores:      Patellar reflexes are 2+ on the right side and 2+ on the left side.      Achilles reflexes are 2+ on the right side and 2+ on the left side. No strength deficit noted in hip and knee flexor and extensor muscle groups.  Ankle flexion and extension intact.  Skin: Skin is warm and dry.  Psychiatric: She has a normal mood and affect.    ED Course  Procedures DIAGNOSTIC STUDIES: Oxygen Saturation is 100% on RA, normal by my interpretation.  COORDINATION OF CARE: 1Labs Review Labs Reviewed - No data to display  Imaging Review No results found.   EKG Interpretation None      MDM   Final diagnoses:  Lumbar radiculopathy    No neuro deficit on exam or by history to suggest emergent or surgical presentation.  Also discussed worsened sx that should prompt immediate re-evaluation including distal weakness, bowel/bladder retention/incontinence.  Patients labs and/or radiological studies were viewed and considered during the medical decision making and disposition process.  Prescribed hydrocodone.  Cautioned pt about overmedicating with ibuprofen, advised max safe dose of ibuprofen 800 mg q8 hours. Pt understands.  Advised f/u with pcp for further eval and management.     I personally performed the services described in this documentation, which was scribed in my presence. The recorded information has been reviewed and is accurate.   Burgess Amor, PA-C 07/17/14 1321

## 2014-07-17 NOTE — ED Provider Notes (Signed)
Medical screening examination/treatment/procedure(s) were performed by non-physician practitioner and as supervising physician I was immediately available for consultation/collaboration.   EKG Interpretation None        Synda Bagent W Jevaughn Degollado, MD 07/17/14 1409 

## 2015-01-24 ENCOUNTER — Emergency Department (HOSPITAL_COMMUNITY)
Admission: EM | Admit: 2015-01-24 | Discharge: 2015-01-24 | Disposition: A | Discharge status: Home / Self Care | Payer: BLUE CROSS/BLUE SHIELD | Attending: Emergency Medicine | Admitting: Emergency Medicine

## 2015-01-24 ENCOUNTER — Emergency Department (HOSPITAL_COMMUNITY): Payer: BLUE CROSS/BLUE SHIELD

## 2015-01-24 ENCOUNTER — Encounter (HOSPITAL_COMMUNITY): Payer: Self-pay | Admitting: *Deleted

## 2015-01-24 DIAGNOSIS — Z9889 Other specified postprocedural states: Secondary | ICD-10-CM | POA: Insufficient documentation

## 2015-01-24 DIAGNOSIS — M25561 Pain in right knee: Secondary | ICD-10-CM | POA: Diagnosis not present

## 2015-01-24 DIAGNOSIS — Z720 Tobacco use: Secondary | ICD-10-CM | POA: Insufficient documentation

## 2015-01-24 DIAGNOSIS — G8929 Other chronic pain: Secondary | ICD-10-CM | POA: Insufficient documentation

## 2015-01-24 DIAGNOSIS — E119 Type 2 diabetes mellitus without complications: Secondary | ICD-10-CM | POA: Diagnosis not present

## 2015-01-24 DIAGNOSIS — M25461 Effusion, right knee: Secondary | ICD-10-CM | POA: Diagnosis not present

## 2015-01-24 MED ORDER — NAPROXEN 500 MG PO TABS: 500.0000 mg | ORAL_TABLET | Freq: Two times a day (BID) | ORAL | Status: DC

## 2015-01-24 MED ORDER — HYDROCODONE-ACETAMINOPHEN 5-325 MG PO TABS: 1.0000 | ORAL_TABLET | ORAL | Status: DC | PRN

## 2015-01-24 MED ORDER — HYDROCODONE-ACETAMINOPHEN 5-325 MG PO TABS
1.0000 | ORAL_TABLET | Freq: Once | ORAL | Status: AC
  Administered 2015-01-24: 1 via ORAL
  Filled 2015-01-24: qty 1

## 2015-01-24 NOTE — ED Provider Notes (Signed)
CSN: [id number]     Arrival date & time 01/24/15  1003 History   First MD Initiated Contact with Patient 01/24/15 1011     Chief Complaint  Patient presents with  . Knee Pain     (Consider location/radiation/quality/duration/timing/severity/associated sxs/prior Treatment) The history is provided by the patient.   Emily Simmons is a 39 y.o. female presenting with acute on chronic right knee pain.  She denies new injury, but works a job requiring constant standing.  She has a history of ACL repair when a teenager and endorses intermittent episodes of pain and swelling.  She woke today with inability to completely extend the knee without severe pain.  She has used tylenol and ibuprofen without relief.     Past Medical History  Diagnosis Date  . Diabetes mellitus without complication    Past Surgical History  Procedure Laterality Date  . Knee surgery    . Hand surgery    . Tubal ligation     Family History  Problem Relation Age of Onset  . Diabetes Mother   . Hypertension Mother   . Asthma Mother   . Diabetes Father   . Asthma Father   . Hypertension Sister    History  Substance Use Topics  . Smoking status: Current Every Day Smoker    Types: Cigarettes  . Smokeless tobacco: Not on file  . Alcohol Use: Yes     Comment: occ   OB History    No data available     Review of Systems  Constitutional: Negative for fever.  Musculoskeletal: Positive for joint swelling and arthralgias. Negative for myalgias.  Neurological: Negative for weakness and numbness.      Allergies  Review of patient's allergies indicates no known allergies.  Home Medications   Prior to Admission medications   Medication Sig Start Date End Date Taking? Authorizing Provider  acetaminophen (TYLENOL) 500 MG tablet Take 1,000 mg by mouth every 8 (eight) hours as needed for mild pain.   Yes Historical Provider, MD  HYDROcodone-acetaminophen (NORCO/VICODIN) 5-325 MG per tablet Take 1 tablet by mouth  every 4 (four) hours as needed. 01/24/15   Burgess AmorJulie Kaidynce Pfister, PA-C  ibuprofen (ADVIL,MOTRIN) 200 MG tablet Take 800 mg by mouth every 6 (six) hours as needed for moderate pain.    Historical Provider, MD  naproxen (NAPROSYN) 500 MG tablet Take 1 tablet (500 mg total) by mouth 2 (two) times daily. 01/24/15   Burgess AmorJulie Tiburcio Linder, PA-C   BP 119/82 mmHg  Pulse 86  Temp(Src) 98.8 F (37.1 C) (Oral)  Resp 16  Ht 5\' 8"  (1.727 m)  Wt 225 lb (102.059 kg)  BMI 34.22 kg/m2  SpO2 100%  LMP 01/15/2015 Physical Exam  Constitutional: She appears well-developed and well-nourished.  HENT:  Head: Atraumatic.  Neck: Normal range of motion.  Cardiovascular:  Pulses equal bilaterally  Musculoskeletal: She exhibits edema and tenderness.       Right knee: She exhibits swelling. She exhibits no effusion, no ecchymosis, no deformity, no erythema, normal alignment, no LCL laxity and no MCL laxity. Tenderness found. Medial joint line and patellar tendon tenderness noted.  Mild edema noted at tibial tuberosity.  TTP  Neurological: She is alert. She has normal strength. She displays normal reflexes. No sensory deficit.  Skin: Skin is warm and dry.  Psychiatric: She has a normal mood and affect.    ED Course  Procedures (including critical care time) Labs Review Labs Reviewed - No data to display  Imaging Review  Dg Knee Complete 4 Views Right  01/24/2015   CLINICAL DATA:  Knee pain. Recurrent knee pain. History ACL surgical repair  EXAM: RIGHT KNEE - COMPLETE 4+ VIEW  COMPARISON:  Radiographs 01/19/2011  FINDINGS: Postsurgical repair of the anterior cruciate ligament is again noted. No evidence of acute fracture dislocation. No joint effusion. There is chondrocalcinosis within the medial and lateral joint compartment.  IMPRESSION: No acute osseous abnormality.  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   Electronically Signed   By: Genevive Bi M.D.   On: 01/24/2015  11:16     EKG Interpretation None      MDM   Final diagnoses:  Knee pain, acute, right    Patients labs and/or radiological studies were reviewed and considered during the medical decision making and disposition process.  Results were also discussed with patient. Pt with acute on chronic intermittent knee pain.  She was placed on crutches, advised RICE, naproxen, hydrocodone.  F/u with ortho if sx persist, referral given.  The patient appears reasonably screened and/or stabilized for discharge and I doubt any other medical condition or other [facility] requiring further screening, evaluation, or treatment in the ED at this time prior to discharge.     Burgess Amor, PA-C 01/24/15 1508  Donnetta Hutching, MD 01/28/15 1158

## 2015-01-24 NOTE — Discharge Instructions (Signed)

## 2015-01-24 NOTE — ED Notes (Signed)
Patient transported to X-ray 

## 2015-01-24 NOTE — ED Notes (Signed)
Recurrent pain to old right knee injury which flared up Monday.

## 2015-01-29 ENCOUNTER — Other Ambulatory Visit (HOSPITAL_COMMUNITY): Payer: Self-pay | Admitting: Orthopaedic Surgery

## 2015-01-29 DIAGNOSIS — M25561 Pain in right knee: Secondary | ICD-10-CM

## 2015-02-05 ENCOUNTER — Ambulatory Visit (HOSPITAL_COMMUNITY)
Admission: RE | Admit: 2015-02-05 | Discharge: 2015-02-05 | Disposition: A | Discharge status: Home / Self Care | Payer: BLUE CROSS/BLUE SHIELD | Source: Ambulatory Visit | Attending: Orthopaedic Surgery | Admitting: Orthopaedic Surgery

## 2015-02-05 DIAGNOSIS — M25561 Pain in right knee: Secondary | ICD-10-CM | POA: Insufficient documentation

## 2015-05-06 ENCOUNTER — Emergency Department (HOSPITAL_COMMUNITY)
Admission: EM | Admit: 2015-05-06 | Discharge: 2015-05-06 | Disposition: A | Discharge status: Home / Self Care | Payer: BLUE CROSS/BLUE SHIELD | Attending: Emergency Medicine | Admitting: Emergency Medicine

## 2015-05-06 ENCOUNTER — Encounter (HOSPITAL_COMMUNITY): Payer: Self-pay | Admitting: *Deleted

## 2015-05-06 DIAGNOSIS — E119 Type 2 diabetes mellitus without complications: Secondary | ICD-10-CM | POA: Diagnosis not present

## 2015-05-06 DIAGNOSIS — Y990 Civilian activity done for income or pay: Secondary | ICD-10-CM | POA: Diagnosis not present

## 2015-05-06 DIAGNOSIS — Z791 Long term (current) use of non-steroidal anti-inflammatories (NSAID): Secondary | ICD-10-CM | POA: Diagnosis not present

## 2015-05-06 DIAGNOSIS — Z720 Tobacco use: Secondary | ICD-10-CM | POA: Insufficient documentation

## 2015-05-06 DIAGNOSIS — S39012A Strain of muscle, fascia and tendon of lower back, initial encounter: Secondary | ICD-10-CM | POA: Insufficient documentation

## 2015-05-06 DIAGNOSIS — G8929 Other chronic pain: Secondary | ICD-10-CM | POA: Insufficient documentation

## 2015-05-06 DIAGNOSIS — T148XXA Other injury of unspecified body region, initial encounter: Secondary | ICD-10-CM

## 2015-05-06 DIAGNOSIS — Y9389 Activity, other specified: Secondary | ICD-10-CM | POA: Diagnosis not present

## 2015-05-06 DIAGNOSIS — Y9289 Other specified places as the place of occurrence of the external cause: Secondary | ICD-10-CM | POA: Insufficient documentation

## 2015-05-06 DIAGNOSIS — X58XXXA Exposure to other specified factors, initial encounter: Secondary | ICD-10-CM | POA: Diagnosis not present

## 2015-05-06 DIAGNOSIS — S3992XA Unspecified injury of lower back, initial encounter: Secondary | ICD-10-CM | POA: Diagnosis present

## 2015-05-06 DIAGNOSIS — Z3202 Encounter for pregnancy test, result negative: Secondary | ICD-10-CM | POA: Insufficient documentation

## 2015-05-06 DIAGNOSIS — S29012A Strain of muscle and tendon of back wall of thorax, initial encounter: Secondary | ICD-10-CM | POA: Insufficient documentation

## 2015-05-06 HISTORY — DX: Dorsalgia, unspecified: M54.9

## 2015-05-06 HISTORY — DX: Other chronic pain: G89.29

## 2015-05-06 LAB — URINALYSIS, ROUTINE W REFLEX MICROSCOPIC
Bilirubin Urine: NEGATIVE
Glucose, UA: NEGATIVE mg/dL
KETONES UR: NEGATIVE mg/dL
LEUKOCYTES UA: NEGATIVE
NITRITE: NEGATIVE
Protein, ur: NEGATIVE mg/dL
SPECIFIC GRAVITY, URINE: 1.02 (ref 1.005–1.030)
Urobilinogen, UA: 0.2 mg/dL (ref 0.0–1.0)
pH: 7 (ref 5.0–8.0)

## 2015-05-06 LAB — URINE MICROSCOPIC-ADD ON

## 2015-05-06 LAB — POC URINE PREG, ED: Preg Test, Ur: NEGATIVE

## 2015-05-06 MED ORDER — OXYCODONE-ACETAMINOPHEN 5-325 MG PO TABS
1.0000 | ORAL_TABLET | Freq: Once | ORAL | Status: AC
Start: 2015-05-06 — End: 2015-05-06
  Administered 2015-05-06: 1 via ORAL
  Filled 2015-05-06: qty 1

## 2015-05-06 MED ORDER — OXYCODONE-ACETAMINOPHEN 5-325 MG PO TABS: 1.0000 | ORAL_TABLET | ORAL | Status: DC | PRN

## 2015-05-06 MED ORDER — IBUPROFEN 800 MG PO TABS
800.0000 mg | ORAL_TABLET | Freq: Once | ORAL | Status: AC
  Administered 2015-05-06: 800 mg via ORAL
  Filled 2015-05-06: qty 1

## 2015-05-06 MED ORDER — IBUPROFEN 800 MG PO TABS: 800.0000 mg | ORAL_TABLET | Freq: Three times a day (TID) | ORAL | Status: DC

## 2015-05-06 NOTE — ED Notes (Signed)
Pt comes in from her work place with right lower back pain that started yesterday with worsening today while she was at work. Pt took a muscle relaxer this am with no relief noted. Pt is in NAD.

## 2015-05-06 NOTE — ED Provider Notes (Signed)
CSN: [id number]     Arrival date & time 05/06/15  [id number] History   First MD Initiated Contact with Patient 05/06/15 [id number]     Chief Complaint  Patient presents with  . Back Pain     (Consider location/radiation/quality/duration/timing/severity/associated sxs/prior Treatment) HPI   Emily Simmons is a 39 y.o. female who presents to the Emergency Department complaining of right sided low back pain for one day.  She states the pain as "like an ache" yesterday, then she went to work this morning and as she stood up to clock in, she felt a sharp tingling pain from her right shoulder blade down to her right buttock.  Pain is worse with movement and improves as rest.  She describes the pain as stabbing.  She took a muscle relaxer prior to arrival without relief.  She denies numbness or weakness of the extremities, urine or bowel changes, fever, chest pain, shortness of breath, and chest pain.      Past Medical History  Diagnosis Date  . Diabetes mellitus without complication   . Chronic back pain 2008   Past Surgical History  Procedure Laterality Date  . Knee surgery    . Hand surgery    . Tubal ligation     Family History  Problem Relation Age of Onset  . Diabetes Mother   . Hypertension Mother   . Asthma Mother   . Diabetes Father   . Asthma Father   . Hypertension Sister    History  Substance Use Topics  . Smoking status: Current Every Day Smoker    Types: Cigarettes  . Smokeless tobacco: Not on file  . Alcohol Use: Yes     Comment: occ   OB History    No data available     Review of Systems  Constitutional: Negative for fever.  Respiratory: Negative for shortness of breath.   Gastrointestinal: Negative for vomiting, abdominal pain and constipation.  Genitourinary: Negative for dysuria, hematuria, flank pain, decreased urine volume and difficulty urinating.  Musculoskeletal: Positive for back pain. Negative for joint swelling.  Skin: Negative for rash.  Neurological:  Negative for weakness and numbness.  All other systems reviewed and are negative.     Allergies  Review of patient's allergies indicates no known allergies.  Home Medications   Prior to Admission medications   Medication Sig Start Date End Date Taking? Authorizing Provider  acetaminophen (TYLENOL) 500 MG tablet Take 1,000 mg by mouth every 8 (eight) hours as needed for mild pain.    Historical Provider, MD  HYDROcodone-acetaminophen (NORCO/VICODIN) 5-325 MG per tablet Take 1 tablet by mouth every 4 (four) hours as needed. 01/24/15   Burgess AmorJulie Idol, PA-C  ibuprofen (ADVIL,MOTRIN) 200 MG tablet Take 800 mg by mouth every 6 (six) hours as needed for moderate pain.    Historical Provider, MD  naproxen (NAPROSYN) 500 MG tablet Take 1 tablet (500 mg total) by mouth 2 (two) times daily. 01/24/15   Burgess AmorJulie Idol, PA-C   BP 134/83 mmHg  Pulse 66  Temp(Src) 98.4 F (36.9 C) (Oral)  Resp 16  Ht 5\' 7"  (1.702 m)  Wt 230 lb (104.327 kg)  BMI 36.01 kg/m2  SpO2 98%  LMP 05/03/2015 Physical Exam  Constitutional: She is oriented to person, place, and time. She appears well-developed and well-nourished. No distress.  HENT:  Head: Normocephalic and atraumatic.  Neck: Normal range of motion. Neck supple.  Cardiovascular: Normal rate, regular rhythm, normal heart sounds and intact distal pulses.  No murmur heard. Pulmonary/Chest: Effort normal and breath sounds normal. No respiratory distress.  Abdominal: Soft. She exhibits no distension. There is no tenderness.  Musculoskeletal: She exhibits tenderness. She exhibits no edema.       Lumbar back: She exhibits tenderness and pain. She exhibits normal range of motion, no swelling, no deformity, no laceration and normal pulse.  ttp of the right thoracic and lumbar paraspinal muscles.  No spinal tenderness.  DP pulses are brisk and symmetrical.  Distal sensation intact.  Hip Flexors/Extensors are intact.  Pt has 5/5 strength against resistance of bilateral upper  and lower extremities.     Neurological: She is alert and oriented to person, place, and time. She has normal strength. No sensory deficit. She exhibits normal muscle tone. Coordination and gait normal.  Reflex Scores:      Patellar reflexes are 2+ on the right side and 2+ on the left side.      Achilles reflexes are 2+ on the right side and 2+ on the left side. Skin: Skin is warm and dry. No rash noted.  Nursing note and vitals reviewed.   ED Course  Procedures (including critical care time) Labs Review Labs Reviewed - No data to display  Imaging Review No results found.   EKG Interpretation None      MDM   Final diagnoses:  Muscle strain    Pt is resting comfortably after medications.  Pain has resolved.  Likely muscle spasm vs strain.  She agrees to symptomatic tx and PMD f/u if needed.  She is ambulatory, no focal neuro deficits, no concerning sx's for emergent neurological or infectious process.     Pauline Aus, PA-C 05/06/15 1132  Bethann Berkshire, MD 05/06/15 [phone]

## 2015-05-06 NOTE — Discharge Instructions (Signed)

## 2015-05-06 NOTE — ED Notes (Signed)
Called lab to check on UA results.  

## 2015-09-03 ENCOUNTER — Encounter (HOSPITAL_COMMUNITY): Payer: Self-pay | Admitting: Emergency Medicine

## 2015-09-03 ENCOUNTER — Emergency Department (HOSPITAL_COMMUNITY)
Admission: EM | Admit: 2015-09-03 | Discharge: 2015-09-03 | Disposition: A | Discharge status: Home / Self Care | Payer: BLUE CROSS/BLUE SHIELD | Attending: Emergency Medicine | Admitting: Emergency Medicine

## 2015-09-03 DIAGNOSIS — L539 Erythematous condition, unspecified: Secondary | ICD-10-CM | POA: Insufficient documentation

## 2015-09-03 DIAGNOSIS — L02412 Cutaneous abscess of left axilla: Secondary | ICD-10-CM | POA: Diagnosis present

## 2015-09-03 DIAGNOSIS — G8929 Other chronic pain: Secondary | ICD-10-CM | POA: Diagnosis not present

## 2015-09-03 DIAGNOSIS — E119 Type 2 diabetes mellitus without complications: Secondary | ICD-10-CM | POA: Insufficient documentation

## 2015-09-03 DIAGNOSIS — L732 Hidradenitis suppurativa: Secondary | ICD-10-CM

## 2015-09-03 DIAGNOSIS — F17210 Nicotine dependence, cigarettes, uncomplicated: Secondary | ICD-10-CM | POA: Diagnosis not present

## 2015-09-03 MED ORDER — OXYCODONE-ACETAMINOPHEN 5-325 MG PO TABS
1.0000 | ORAL_TABLET | Freq: Once | ORAL | Status: AC
  Administered 2015-09-03: 1 via ORAL
  Filled 2015-09-03: qty 1

## 2015-09-03 MED ORDER — HYDROCODONE-ACETAMINOPHEN 7.5-325 MG PO TABS: 1.0000 | ORAL_TABLET | Freq: Four times a day (QID) | ORAL | Status: DC | PRN

## 2015-09-03 MED ORDER — LIDOCAINE-EPINEPHRINE-TETRACAINE (LET) SOLUTION
3.0000 mL | Freq: Once | NASAL | Status: AC
  Administered 2015-09-03: 3 mL via TOPICAL
  Filled 2015-09-03: qty 3

## 2015-09-03 MED ORDER — SULFAMETHOXAZOLE-TRIMETHOPRIM 800-160 MG PO TABS: 1.0000 | ORAL_TABLET | Freq: Two times a day (BID) | ORAL | Status: AC

## 2015-09-03 MED ORDER — LIDOCAINE HCL (PF) 2 % IJ SOLN
10.0000 mL | Freq: Once | INTRAMUSCULAR | Status: AC
  Administered 2015-09-03: 10 mL via INTRADERMAL
  Filled 2015-09-03: qty 10

## 2015-09-03 MED ORDER — SULFAMETHOXAZOLE-TRIMETHOPRIM 800-160 MG PO TABS
1.0000 | ORAL_TABLET | Freq: Once | ORAL | Status: AC
  Administered 2015-09-03: 1 via ORAL
  Filled 2015-09-03: qty 1

## 2015-09-03 MED ORDER — POVIDONE-IODINE 10 % EX SOLN
CUTANEOUS | Status: AC
  Administered 2015-09-03: 19:00:00
  Filled 2015-09-03: qty 118

## 2015-09-03 NOTE — ED Provider Notes (Signed)
CSN: [id number]     Arrival date & time 09/03/15  1654 History   First MD Initiated Contact with Patient 09/03/15 1726     Chief Complaint  Patient presents with  . Abscess     (Consider location/radiation/quality/duration/timing/severity/associated sxs/prior Treatment) HPI   Emily Simmons is a 39 y.o. female who presents to the Emergency Department complaining of pain and abscess to the left axilla.  She states this is a recurrent problem and she was previously advised to f/u with a surgeon, but she hasn't.  Symptoms have been worsening for two days.  Nothing makes the sx's better.  Pain is worse with arm movement and palpation of her armpit.  She denies fever, chills, numbness or drainage.     Past Medical History  Diagnosis Date  . Diabetes mellitus without complication (HCC)   . Chronic back pain 2008   Past Surgical History  Procedure Laterality Date  . Knee surgery    . Hand surgery    . Tubal ligation     Family History  Problem Relation Age of Onset  . Diabetes Mother   . Hypertension Mother   . Asthma Mother   . Diabetes Father   . Asthma Father   . Hypertension Sister    Social History  Substance Use Topics  . Smoking status: Current Every Day Smoker    Types: Cigarettes  . Smokeless tobacco: None  . Alcohol Use: Yes     Comment: occ   OB History    No data available     Review of Systems  Constitutional: Negative for fever and chills.  Gastrointestinal: Negative for nausea and vomiting.  Musculoskeletal: Negative for joint swelling and arthralgias.  Skin: Positive for color change.       Abscess   Hematological: Negative for adenopathy.  All other systems reviewed and are negative.     Allergies  Review of patient's allergies indicates no known allergies.  Home Medications   Prior to Admission medications   Medication Sig Start Date End Date Taking? Authorizing Provider  aspirin-acetaminophen-caffeine (EXCEDRIN MIGRAINE) [phone] MG per  tablet Take 1 tablet by mouth daily as needed for headache or migraine.    Historical Provider, MD  HYDROcodone-acetaminophen (NORCO/VICODIN) 5-325 MG per tablet Take 1 tablet by mouth every 4 (four) hours as needed. Patient not taking: Reported on 09/03/2015 01/24/15   Burgess Amor, PA-C  ibuprofen (ADVIL,MOTRIN) 800 MG tablet Take 1 tablet (800 mg total) by mouth 3 (three) times daily. Patient not taking: Reported on 09/03/2015 05/06/15   Alvia Jablonski, PA-C  naproxen (NAPROSYN) 500 MG tablet Take 1 tablet (500 mg total) by mouth 2 (two) times daily. Patient not taking: Reported on 05/06/2015 01/24/15   Burgess Amor, PA-C  oxyCODONE-acetaminophen (PERCOCET/ROXICET) 5-325 MG per tablet Take 1 tablet by mouth every 4 (four) hours as needed. Patient not taking: Reported on 09/03/2015 05/06/15   Aldric Wenzler, PA-C   BP 131/90 mmHg  Pulse 95  Temp(Src) 99.1 F (37.3 C) (Oral)  Resp 18  Ht  (1.727 m)  Wt 221 lb (100.245 kg)  BMI 33.61 kg/m2  SpO2 100%  LMP 09/03/2015 Physical Exam  Constitutional: She is oriented to person, place, and time. She appears well-developed and well-nourished. No distress.  HENT:  Head: Normocephalic and atraumatic.  Cardiovascular: Normal rate, regular rhythm and normal heart sounds.   No murmur heard. Pulmonary/Chest: Effort normal and breath sounds normal. No respiratory distress.  Musculoskeletal: Normal range of motion.  Neurological:  She is alert and oriented to person, place, and time. She exhibits normal muscle tone. Coordination normal.  Skin: Skin is warm and dry. There is erythema.  Abscess to the left axilla, moderate fluctuance.  No drainage or surrounding erythema.  Chronic scarring to the axilla.  Nursing note and vitals reviewed.   ED Course  Procedures (including critical care time)    INCISION AND DRAINAGE Performed by: Maxwell CaulRIPLETT,Panagiotis Oelkers L. Consent: Verbal consent obtained. Risks and benefits: risks, benefits and alternatives were  discussed Type: abscess  Body area: left axilla  Anesthesia: topical,  local infiltration  Incision was made with a #11 scalpel.  Local anesthetic: LET,  Lidocaine 2 % w/o epinephrine  Anesthetic total: 3 ml, 3 ml  Complexity: complex Blunt dissection to break up loculations  Drainage: purulent  Drainage amount: copious  Packing material: 1/4 in iodoform gauze  Patient tolerance: Patient tolerated the procedure well with no immediate complications.    MDM   Final diagnoses:  Hidradenitis suppurativa of left axilla   Recurrent hydradenitis suppurativa with abscess. Patient advised to call general surgery to arrange a follow-up appointment since this is a recurrent problem.  She is otherwise well appearing.  Pain improved after I&D.  rx for vicodin and bactrim    Emily Ausammy Mancil Pfenning, PA-C 09/06/15 1544  Rolland PorterMark James, MD 09/11/15 [phone]

## 2015-09-03 NOTE — Discharge Instructions (Signed)

## 2015-09-03 NOTE — ED Notes (Signed)
Patient complains of boil under left axilla since Monday. Denies fever/chills.

## 2015-10-04 ENCOUNTER — Encounter (HOSPITAL_COMMUNITY): Payer: Self-pay | Admitting: Emergency Medicine

## 2015-10-04 ENCOUNTER — Emergency Department (HOSPITAL_COMMUNITY)
Admission: EM | Admit: 2015-10-04 | Discharge: 2015-10-04 | Disposition: A | Discharge status: Home / Self Care | Payer: BLUE CROSS/BLUE SHIELD | Attending: Emergency Medicine | Admitting: Emergency Medicine

## 2015-10-04 ENCOUNTER — Emergency Department (HOSPITAL_COMMUNITY): Payer: BLUE CROSS/BLUE SHIELD

## 2015-10-04 DIAGNOSIS — Z9889 Other specified postprocedural states: Secondary | ICD-10-CM | POA: Insufficient documentation

## 2015-10-04 DIAGNOSIS — F419 Anxiety disorder, unspecified: Secondary | ICD-10-CM | POA: Diagnosis not present

## 2015-10-04 DIAGNOSIS — M25512 Pain in left shoulder: Secondary | ICD-10-CM | POA: Insufficient documentation

## 2015-10-04 DIAGNOSIS — F17210 Nicotine dependence, cigarettes, uncomplicated: Secondary | ICD-10-CM | POA: Diagnosis not present

## 2015-10-04 DIAGNOSIS — R079 Chest pain, unspecified: Secondary | ICD-10-CM | POA: Diagnosis present

## 2015-10-04 DIAGNOSIS — E119 Type 2 diabetes mellitus without complications: Secondary | ICD-10-CM | POA: Diagnosis not present

## 2015-10-04 DIAGNOSIS — G8929 Other chronic pain: Secondary | ICD-10-CM | POA: Insufficient documentation

## 2015-10-04 DIAGNOSIS — R200 Anesthesia of skin: Secondary | ICD-10-CM | POA: Diagnosis not present

## 2015-10-04 DIAGNOSIS — R0789 Other chest pain: Secondary | ICD-10-CM | POA: Insufficient documentation

## 2015-10-04 LAB — BASIC METABOLIC PANEL
Anion gap: 10 (ref 5–15)
BUN: 8 mg/dL (ref 6–20)
CO2: 27 mmol/L (ref 22–32)
CREATININE: 0.61 mg/dL (ref 0.44–1.00)
Calcium: 9.4 mg/dL (ref 8.9–10.3)
Chloride: 99 mmol/L — ABNORMAL LOW (ref 101–111)
GFR calc Af Amer: >60 mL/min (ref >60)
GFR calc non Af Amer: >60 mL/min (ref >60)
Glucose, Bld: 123 mg/dL — ABNORMAL HIGH (ref 65–99)
Potassium: 4.2 mmol/L (ref 3.5–5.1)
Sodium: 136 mmol/L (ref 135–145)

## 2015-10-04 LAB — CBC WITH DIFFERENTIAL/PLATELET
Basophils Absolute: 0 10*3/uL (ref 0.0–0.1)
Basophils Relative: 0 %
EOS ABS: 0.2 10*3/uL (ref 0.0–0.7)
Eosinophils Relative: 1 %
HEMATOCRIT: 37.7 % (ref 36.0–46.0)
Hemoglobin: 13 g/dL (ref 12.0–15.0)
LYMPHS ABS: 4 10*3/uL (ref 0.7–4.0)
LYMPHS PCT: 24 %
MCH: 28.1 pg (ref 26.0–34.0)
MCHC: 34.5 g/dL (ref 30.0–36.0)
MCV: 81.4 fL (ref 78.0–100.0)
MONOS PCT: 9 %
Monocytes Absolute: 1.4 10*3/uL — ABNORMAL HIGH (ref 0.1–1.0)
NEUTROS ABS: 10.9 10*3/uL — AB (ref 1.7–7.7)
NEUTROS PCT: 66 %
Platelets: 286 10*3/uL (ref 150–400)
RBC: 4.63 MIL/uL (ref 3.87–5.11)
RDW: 14.6 % (ref 11.5–15.5)
WBC: 16.6 10*3/uL — AB (ref 4.0–10.5)

## 2015-10-04 LAB — TROPONIN I: Troponin I: <0.03 ng/mL (ref <0.031)

## 2015-10-04 LAB — CBG MONITORING, ED: Glucose-Capillary: 116 mg/dL — ABNORMAL HIGH (ref 65–99)

## 2015-10-04 MED ORDER — IBUPROFEN 600 MG PO TABS: 600.0000 mg | ORAL_TABLET | Freq: Four times a day (QID) | ORAL | Status: DC | PRN

## 2015-10-04 MED ORDER — OXYCODONE-ACETAMINOPHEN 5-325 MG PO TABS: 1.0000 | ORAL_TABLET | ORAL | Status: DC | PRN

## 2015-10-04 MED ORDER — KETOROLAC TROMETHAMINE 30 MG/ML IJ SOLN
30.0000 mg | Freq: Once | INTRAMUSCULAR | Status: AC
  Administered 2015-10-04: 30 mg via INTRAVENOUS
  Filled 2015-10-04: qty 1

## 2015-10-04 MED ORDER — OXYCODONE-ACETAMINOPHEN 5-325 MG PO TABS
1.0000 | ORAL_TABLET | Freq: Once | ORAL | Status: AC
  Administered 2015-10-04: 1 via ORAL
  Filled 2015-10-04: qty 1

## 2015-10-04 MED ORDER — MORPHINE SULFATE (PF) 4 MG/ML IV SOLN
4.0000 mg | Freq: Once | INTRAVENOUS | Status: AC
  Administered 2015-10-04: 4 mg via INTRAVENOUS
  Filled 2015-10-04: qty 1

## 2015-10-04 NOTE — Discharge Instructions (Signed)

## 2015-10-04 NOTE — ED Provider Notes (Signed)
CSN: [id number]     Arrival date & time 10/04/15  1735 History   First MD Initiated Contact with Patient 10/04/15 1744     Chief Complaint  Patient presents with  . Numbness     Patient is a 39 y.o. female presenting with chest pain. The history is provided by the patient.  Chest Pain Pain location:  L chest Pain quality: aching   Pain severity:  Moderate Onset quality:  Gradual Duration:  2 days Timing:  Constant Chronicity:  New Relieved by:  Nothing Worsened by:  Movement and certain positions (palpation) Associated symptoms: numbness   Associated symptoms: no dizziness, no fever, no shortness of breath and not vomiting   Risk factors: no coronary artery disease   pt reports "numbness and pain" in her left anterior neck, left chest, left arm and left abdomen No weakness but reports it is numb and painful No fever/vomiting No HA No visual changes No slurred speech No back pain No midline neck pain No fall/injury  She denies h/o CVA/CAD  She denies pain/weakness to legs (she told nurse left leg was weak with standing)  Past Medical History  Diagnosis Date  . Diabetes mellitus without complication (HCC)   . Chronic back pain 2008   Past Surgical History  Procedure Laterality Date  . Knee surgery    . Hand surgery    . Tubal ligation     Family History  Problem Relation Age of Onset  . Diabetes Mother   . Hypertension Mother   . Asthma Mother   . Diabetes Father   . Asthma Father   . Hypertension Sister    Social History  Substance Use Topics  . Smoking status: Current Every Day Smoker    Types: Cigarettes  . Smokeless tobacco: Never Used  . Alcohol Use: Yes     Comment: occ   OB History    No data available     Review of Systems  Constitutional: Negative for fever.  Eyes: Negative for visual disturbance.  Respiratory: Negative for shortness of breath.   Cardiovascular: Positive for chest pain.  Gastrointestinal: Negative for vomiting.   Neurological: Positive for numbness. Negative for dizziness.  All other systems reviewed and are negative.     Allergies  Review of patient's allergies indicates no known allergies.  Home Medications   Prior to Admission medications   Medication Sig Start Date End Date Taking? Authorizing Provider  aspirin-acetaminophen-caffeine (EXCEDRIN MIGRAINE) [phone] MG per tablet Take 1 tablet by mouth daily as needed for headache or migraine.    Historical Provider, MD  HYDROcodone-acetaminophen (NORCO) 7.5-325 MG tablet Take 1 tablet by mouth every 6 (six) hours as needed for moderate pain. 09/03/15   Tammy Triplett, PA-C   BP 129/85 mmHg  Pulse 106  Temp(Src) 98.8 F (37.1 C) (Oral)  Resp 20  Ht  (1.727 m)  Wt 101.606 kg  BMI 34.07 kg/m2  SpO2 99%  LMP 10/03/2015 Physical Exam CONSTITUTIONAL: Well developed/well nourished, crying, anxious HEAD: Normocephalic/atraumatic EYES: EOMI/PERRL ENMT: Mucous membranes moist, no bruits NECK: supple no meningeal signs SPINE/BACK:entire spine nontender CV: S1/S2 noted, no murmurs/rubs/gallops noted LUNGS: Lungs are clear to auscultation bilaterally, no apparent distress Chest - diffuse left chest wall tenderness, no crepitus or erythema/rash noted ABDOMEN: soft, nontender, no rebound or guarding, bowel sounds noted throughout abdomen GU:no cva tenderness NEURO: Pt is awake/alert/appropriate, moves all extremitiesx4.  No facial droop.  She has limitation in movement of left arm due to significant  pain.  No leg drift noted.  No sensory deficit noted in left UE/LE EXTREMITIES: pulses normal/equal, full ROM, diffuse tenderness to left UE but no bruising/ deformity/erythema/edema noted SKIN: warm, color normal PSYCH: no abnormalities of mood noted, alert and oriented to situation  ED Course  Procedures  Medications  ketorolac (TORADOL) 30 MG/ML injection 30 mg (not administered)  oxyCODONE-acetaminophen (PERCOCET/ROXICET) 5-325 MG per  tablet 1 tablet (not administered)  morphine 4 MG/ML injection 4 mg (4 mg Intravenous Given 10/04/15 1825)    6:14 PM Pt initially reported numbness, but her main issue appears to be pain in left chest/arm.  She is tearful and starts crying throughout exam Currently does not appear to be acute CVA Due to CP, CXR and labs ordered 7:45 PM Labs/imaging negative She can ambulate without difficulty and no motor weakness in her legs She points to left shoulder as source of pain and has diffuse tenderness to left shoulder/scapula and left chest.  No signs of trauma and she denies fall She has grip strength in left hand and has motor function in left UE but limited due to pain Her reflexes are intact in left UE (biceps/brachioradialis) She has no numbness in her left UE at this time Some of her pain radiates from neck into left UE so radiculopathy possible but does not explain chest wall pain At this point, I have low suspicion for ACS or acute CVA or other neurologic emergency 8:35 PM Pt requesting d/c home BP 116/73 mmHg  Pulse 88  Temp(Src) 98.8 F (37.1 C) (Oral)  Resp 15  Ht  (1.727 m)  Wt 101.606 kg  BMI 34.07 kg/m2  SpO2 99%  LMP 10/03/2015 Discussed strict return precautions  Labs Review Labs Reviewed  CBC WITH DIFFERENTIAL/PLATELET - Abnormal; Notable for the following:    WBC 16.6 (*)    Neutro Abs 10.9 (*)    Monocytes Absolute 1.4 (*)    All other components within normal limits  BASIC METABOLIC PANEL - Abnormal; Notable for the following:    Chloride 99 (*)    Glucose, Bld 123 (*)    All other components within normal limits  CBG MONITORING, ED - Abnormal; Notable for the following:    Glucose-Capillary 116 (*)    All other components within normal limits  TROPONIN I    Imaging Review Dg Chest 2 View  10/04/2015  CLINICAL DATA:  Pain. Left-sided numbness radiating down neck. Left leg weakness. EXAM: CHEST  2 VIEW COMPARISON:  None. FINDINGS: The  cardiomediastinal contours are normal. The lungs are clear. Pulmonary vasculature is normal. No consolidation, pleural effusion, or pneumothorax. No acute osseous abnormalities are seen. IMPRESSION: No acute pulmonary process. Electronically Signed   By: Rubye Oaks M.D.   On: 10/04/2015 18:50   I have personally reviewed and evaluated these images and lab results as part of my medical decision-making.   EKG Interpretation   Date/Time:  Saturday October 04 2015 17:56:28 EST Ventricular Rate:  102 PR Interval:  151 QRS Duration: 80 QT Interval:  324 QTC Calculation: 422 R Axis:   -20 Text Interpretation:  Sinus tachycardia Borderline left axis deviation ST  elev, probable normal early repol pattern Confirmed by Bebe Shaggy  MD,  Hannan Hutmacher ([id number]) on 10/04/2015 6:09:35 PM      MDM   Final diagnoses:  Chest wall pain  Left shoulder pain    Nursing notes including past medical history and social history reviewed and considered in documentation Labs/vital reviewed myself and  considered during evaluation xrays/imaging reviewed by myself and considered during evaluation     Zadie Rhineonald Yashvi Jasinski, MD 10/04/15 2036

## 2015-10-04 NOTE — ED Notes (Signed)
Patient states pain is better and wants to go home.

## 2015-10-04 NOTE — ED Notes (Signed)
MD at bedside. 

## 2015-10-04 NOTE — ED Notes (Signed)
Patient c/o left sided numbness that radiates from neck down to waist. Patient also reports weakness in left leg with standing. Per patient numbness and weakness started on Thursday. Denies any dizziness, blurred vision, slurred speech, or facial drooping.

## 2016-02-20 ENCOUNTER — Emergency Department (HOSPITAL_COMMUNITY)
Admission: EM | Admit: 2016-02-20 | Discharge: 2016-02-20 | Disposition: A | Discharge status: Home / Self Care | Payer: BLUE CROSS/BLUE SHIELD | Attending: Emergency Medicine | Admitting: Emergency Medicine

## 2016-02-20 ENCOUNTER — Encounter (HOSPITAL_COMMUNITY): Payer: Self-pay | Admitting: Emergency Medicine

## 2016-02-20 DIAGNOSIS — L02412 Cutaneous abscess of left axilla: Secondary | ICD-10-CM | POA: Insufficient documentation

## 2016-02-20 DIAGNOSIS — E119 Type 2 diabetes mellitus without complications: Secondary | ICD-10-CM | POA: Diagnosis not present

## 2016-02-20 DIAGNOSIS — Z79899 Other long term (current) drug therapy: Secondary | ICD-10-CM | POA: Diagnosis not present

## 2016-02-20 DIAGNOSIS — F17210 Nicotine dependence, cigarettes, uncomplicated: Secondary | ICD-10-CM | POA: Diagnosis not present

## 2016-02-20 MED ORDER — DOXYCYCLINE HYCLATE 100 MG PO CAPS: 100.0000 mg | ORAL_CAPSULE | Freq: Two times a day (BID) | ORAL | Status: DC

## 2016-02-20 MED ORDER — DOXYCYCLINE HYCLATE 100 MG PO TABS
100.0000 mg | ORAL_TABLET | Freq: Once | ORAL | Status: AC
  Administered 2016-02-20: 100 mg via ORAL
  Filled 2016-02-20: qty 1

## 2016-02-20 MED ORDER — HYDROCODONE-ACETAMINOPHEN 5-325 MG PO TABS
2.0000 | ORAL_TABLET | Freq: Once | ORAL | Status: AC
  Administered 2016-02-20: 2 via ORAL
  Filled 2016-02-20: qty 2

## 2016-02-20 MED ORDER — HYDROCODONE-ACETAMINOPHEN 5-325 MG PO TABS: 1.0000 | ORAL_TABLET | ORAL | Status: DC | PRN

## 2016-02-20 MED ORDER — LIDOCAINE-EPINEPHRINE (PF) 2 %-1:200000 IJ SOLN
INTRAMUSCULAR | Status: AC
  Filled 2016-02-20: qty 20

## 2016-02-20 MED ORDER — PROMETHAZINE HCL 12.5 MG PO TABS
25.0000 mg | ORAL_TABLET | Freq: Once | ORAL | Status: AC
  Administered 2016-02-20: 25 mg via ORAL
  Filled 2016-02-20: qty 2

## 2016-02-20 MED ORDER — IBUPROFEN 800 MG PO TABS
800.0000 mg | ORAL_TABLET | Freq: Once | ORAL | Status: AC
  Administered 2016-02-20: 800 mg via ORAL
  Filled 2016-02-20: qty 1

## 2016-02-20 NOTE — ED Provider Notes (Signed)
CSN: [id number]     Arrival date & time 02/20/16  1738 History   First MD Initiated Contact with Patient 02/20/16 1820     Chief Complaint  Patient presents with  . Abscess     (Consider location/radiation/quality/duration/timing/severity/associated sxs/prior Treatment) HPI Comments: Patient is a 40 year old female who presents to the emergency department with a complaint of an abscess.  The patient states that 2 days ago she began to noticed a tender raised area of her left axilla. The area is now larger, and is causing problems with her activities of daily living and her work. She has noticed a slight bit of drainage today. She has had abscess problems in this area in the past on. She denies fever or chills. She has not been diagnosed with methicillin-resistant staph in the past. She has tried ibuprofen, but states this is not helping at all.  The history is provided by the patient.    Past Medical History  Diagnosis Date  . Diabetes mellitus without complication (HCC)   . Chronic back pain 2008   Past Surgical History  Procedure Laterality Date  . Knee surgery    . Hand surgery    . Tubal ligation     Family History  Problem Relation Age of Onset  . Diabetes Mother   . Hypertension Mother   . Asthma Mother   . Diabetes Father   . Asthma Father   . Hypertension Sister    Social History  Substance Use Topics  . Smoking status: Current Every Day Smoker -- 0.50 packs/day    Types: Cigarettes  . Smokeless tobacco: Never Used  . Alcohol Use: Yes     Comment: occ   OB History    No data available     Review of Systems  Musculoskeletal: Positive for arthralgias.  Skin: Positive for wound.  All other systems reviewed and are negative.     Allergies  Review of patient's allergies indicates no known allergies.  Home Medications   Prior to Admission medications   Medication Sig Start Date End Date Taking? Authorizing Provider  ibuprofen (ADVIL,MOTRIN) 600 MG  tablet Take 1 tablet (600 mg total) by mouth every 6 (six) hours as needed. 10/04/15   Zadie Rhineonald Wickline, MD  oxyCODONE-acetaminophen (PERCOCET/ROXICET) 5-325 MG tablet Take 1 tablet by mouth every 4 (four) hours as needed for severe pain. 10/04/15   Zadie Rhineonald Wickline, MD   BP 133/83 mmHg  Pulse 105  Temp(Src) 98.8 F (37.1 C) (Oral)  Resp 18  Ht 5\' 8"  (1.727 m)  Wt 100.245 kg  BMI 33.61 kg/m2  SpO2 100%  LMP 02/09/2016 Physical Exam  Constitutional: She is oriented to person, place, and time. She appears well-developed and well-nourished.  Non-toxic appearance.  HENT:  Head: Normocephalic.  Right Ear: Tympanic membrane and external ear normal.  Left Ear: Tympanic membrane and external ear normal.  Eyes: EOM and lids are normal. Pupils are equal, round, and reactive to light.  Neck: Normal range of motion. Neck supple. Carotid bruit is not present.  Cardiovascular: Normal rate, regular rhythm, normal heart sounds, intact distal pulses and normal pulses.   Pulmonary/Chest: Breath sounds normal. No respiratory distress.  Abdominal: Soft. Bowel sounds are normal. There is no tenderness. There is no guarding.  Musculoskeletal: Normal range of motion.  Multiple well-healed scars of the left axilla. There is an abscess of the left axilla with mild cloudy colored drainage present. There no red streaks appreciated. There is full range of motion of  the left elbow, wrist, and fingers. The radial pulses 2+.  Lymphadenopathy:       Head (right side): No submandibular adenopathy present.       Head (left side): No submandibular adenopathy present.    She has no cervical adenopathy.  Neurological: She is alert and oriented to person, place, and time. She has normal strength. No cranial nerve deficit or sensory deficit.  Skin: Skin is warm and dry.  Psychiatric: She has a normal mood and affect. Her speech is normal.  Nursing note and vitals reviewed.   ED Course  .Marland KitchenIncision and  Drainage Date/Time: 02/20/2016 6:50 PM Performed by: Ivery Quale Authorized by: Ivery Quale Consent: Verbal consent obtained. Risks and benefits: risks, benefits and alternatives were discussed Consent given by: patient Patient understanding: patient states understanding of the procedure being performed Patient identity confirmed: arm band Time out: Immediately prior to procedure a "time out" was called to verify the correct patient, procedure, equipment, support staff and site/side marked as required. Type: abscess Body area: upper extremity (left axilla) Anesthesia: local infiltration Local anesthetic: lidocaine 2% without epinephrine Patient sedated: no Scalpel size: 11 Incision type: single straight Incision depth: subcutaneous Complexity: simple Drainage: purulent Drainage amount: moderate Wound treatment: wound left open Patient tolerance: Patient tolerated the procedure well with no immediate complications Comments: Culture sent to the lab   (including critical care time) Labs Review Labs Reviewed - No data to display  Imaging Review No results found. I have personally reviewed and evaluated these images and lab results as part of my medical decision-making.   EKG Interpretation None      MDM  Vital signs reviewed. The patient is noted to have an abscess of the left axilla. Incision and drainage carried out. Culture sent to the lab. The patient will be treated with doxycycline, ibuprofen, and Norco. The patient is to follow-up with her primary physician at [facility] for additional evaluation and management if needed. We discussed the use of warm Epsom salt tub soaks. We also discussed the need to return if any unusual chills, temperature elevation, red streaks going up the arm, or change in general condition.    Final diagnoses:  Abscess of axilla, left    **I have reviewed nursing notes, vital signs, and all appropriate lab and imaging  results for this patient.Ivery Quale, PA-C 02/20/16 1906  Donnetta Hutching, MD 02/24/16 [phone]

## 2016-02-20 NOTE — Discharge Instructions (Signed)
Starting tomorrow night, please soak your left arm area in warm Epsom salt water for 15 minutes daily until the wound has healed from the inside out. Please use doxycycline and 600 mg of ibuprofen 2 times daily with food. Use Norco for more severe pain. Incision and Drainage Incision and drainage is a procedure in which a sac-like structure (cystic structure) is opened and drained. The area to be drained usually contains material such as pus, fluid, or blood.  LET YOUR CAREGIVER KNOW ABOUT:   Allergies to medicine.  Medicines taken, including vitamins, herbs, eyedrops, over-the-counter medicines, and creams.  Use of steroids (by mouth or creams).  Previous problems with anesthetics or numbing medicines.  History of bleeding problems or blood clots.  Previous surgery.  Other health problems, including diabetes and kidney problems.  Possibility of pregnancy, if this applies. RISKS AND COMPLICATIONS  Pain.  Bleeding.  Scarring.  Infection. BEFORE THE PROCEDURE  You may need to have an ultrasound or other imaging tests to see how large or deep your cystic structure is. Blood tests may also be used to determine if you have an infection or how severe the infection is. You may need to have a tetanus shot. PROCEDURE  The affected area is cleaned with a cleaning fluid. The cyst area will then be numbed with a medicine (local anesthetic). A small incision will be made in the cystic structure. A syringe or catheter may be used to drain the contents of the cystic structure, or the contents may be squeezed out. The area will then be flushed with a cleansing solution. After cleansing the area, it is often gently packed with a gauze or another wound dressing. Once it is packed, it will be covered with gauze and tape or some other type of wound dressing. AFTER THE PROCEDURE   Often, you will be allowed to go home right after the procedure.  You may be given antibiotic medicine to prevent or  heal an infection.  If the area was packed with gauze or some other wound dressing, you will likely need to come back in 1 to 2 days to get it removed.  The area should heal in about 14 days.   This information is not intended to replace advice given to you by your health care provider. Make sure you discuss any questions you have with your health care provider.   Document Released: 03/30/2001 Document Revised: 04/04/2012 Document Reviewed: 11/29/2011 Elsevier Interactive Patient Education [employer]2016 Elsevier Inc.

## 2016-02-20 NOTE — ED Notes (Signed)
Large area of redness noted to left axilla since last night. Pt reports has tried warm compresses with no relief. nad noted.

## 2016-02-24 LAB — CULTURE, ROUTINE-ABSCESS: CULTURE: NO GROWTH

## 2016-06-07 ENCOUNTER — Emergency Department (HOSPITAL_COMMUNITY): Payer: BLUE CROSS/BLUE SHIELD

## 2016-06-07 ENCOUNTER — Emergency Department (HOSPITAL_COMMUNITY)
Admission: EM | Admit: 2016-06-07 | Discharge: 2016-06-07 | Disposition: A | Discharge status: Home / Self Care | Payer: BLUE CROSS/BLUE SHIELD | Attending: Emergency Medicine | Admitting: Emergency Medicine

## 2016-06-07 ENCOUNTER — Encounter (HOSPITAL_COMMUNITY): Payer: Self-pay | Admitting: Emergency Medicine

## 2016-06-07 DIAGNOSIS — E119 Type 2 diabetes mellitus without complications: Secondary | ICD-10-CM | POA: Diagnosis not present

## 2016-06-07 DIAGNOSIS — M25571 Pain in right ankle and joints of right foot: Secondary | ICD-10-CM | POA: Diagnosis not present

## 2016-06-07 DIAGNOSIS — F17210 Nicotine dependence, cigarettes, uncomplicated: Secondary | ICD-10-CM | POA: Diagnosis not present

## 2016-06-07 LAB — CBG MONITORING, ED: Glucose-Capillary: 223 mg/dL — ABNORMAL HIGH (ref 65–99)

## 2016-06-07 MED ORDER — DICLOFENAC SODIUM 50 MG PO TBEC: 50.0000 mg | DELAYED_RELEASE_TABLET | Freq: Two times a day (BID) | ORAL | 0 refills | Status: DC

## 2016-06-07 MED ORDER — HYDROCODONE-ACETAMINOPHEN 5-325 MG PO TABS: 1.0000 | ORAL_TABLET | ORAL | 0 refills | Status: DC | PRN

## 2016-06-07 NOTE — ED Triage Notes (Signed)
Pt c/o rt ankle pain since waking up this am. Pt denies any injury.

## 2016-06-07 NOTE — Discharge Instructions (Signed)
See your Physician for recheck in 2-3 days  

## 2016-06-08 NOTE — ED Provider Notes (Signed)
[facility] Provider Note   CSN: [id number] Arrival date & time: 06/07/16  1930     History   Chief Complaint Chief Complaint  Patient presents with  . Ankle Pain    HPI Jamey Reasonya M Marcin is a 40 y.o. female.  The history is provided by the patient. No language interpreter was used.  Ankle Pain   The incident occurred 6 to 12 hours ago. The incident occurred at home. There was no injury mechanism. The pain is present in the right ankle. The quality of the pain is described as aching. The pain is moderate. The pain has been constant since onset. Pertinent negatives include no numbness. She reports no foreign bodies present. She has tried nothing for the symptoms. The treatment provided no relief.  Pt reports swelling and pain to side of ankle. No know injury  Past Medical History:  Diagnosis Date  . Chronic back pain 2008  . Diabetes mellitus without complication (HCC)     There are no active problems to display for this patient.   Past Surgical History:  Procedure Laterality Date  . HAND SURGERY    . KNEE SURGERY    . TUBAL LIGATION      OB History    No data available       Home Medications    Prior to Admission medications   Medication Sig Start Date End Date Taking? Authorizing Provider  diclofenac (VOLTAREN) 50 MG EC tablet Take 1 tablet (50 mg total) by mouth 2 (two) times daily. 06/07/16   Elson AreasLeslie K Yanira Tolsma, PA-C  doxycycline (VIBRAMYCIN) 100 MG capsule Take 1 capsule (100 mg total) by mouth 2 (two) times daily. 02/20/16   Ivery QualeHobson Bryant, PA-C  HYDROcodone-acetaminophen (NORCO/VICODIN) 5-325 MG tablet Take 1-2 tablets by mouth every 4 (four) hours as needed. 06/07/16   Elson AreasLeslie K Aleeza [place], PA-C  ibuprofen (ADVIL,MOTRIN) 600 MG tablet Take 1 tablet (600 mg total) by mouth every 6 (six) hours as needed. 10/04/15   Zadie Rhineonald Wickline, MD  oxyCODONE-acetaminophen (PERCOCET/ROXICET) 5-325 MG tablet Take 1 tablet by mouth every 4 (four) hours as needed for severe pain.  10/04/15   Zadie Rhineonald Wickline, MD    Family History Family History  Problem Relation Age of Onset  . Diabetes Mother   . Hypertension Mother   . Asthma Mother   . Diabetes Father   . Asthma Father   . Hypertension Sister     Social History Social History  Substance Use Topics  . Smoking status: Current Every Day Smoker    Packs/day: 0.50    Types: Cigarettes  . Smokeless tobacco: Never Used  . Alcohol use Yes     Comment: occ     Allergies   Review of patient's allergies indicates no known allergies.   Review of Systems Review of Systems  Neurological: Negative for numbness.  All other systems reviewed and are negative.    Physical Exam Updated Vital Signs BP 132/79 (BP Location: Left Arm)   Pulse 74   Temp 98.6 F (37 C) (Oral)   Resp 20   Ht 5\' 8"  (1.727 m)   Wt 102.1 kg   LMP 05/25/2016   SpO2 100%   BMI 34.21 kg/m   Physical Exam  Constitutional: She appears well-developed and well-nourished. No distress.  HENT:  Head: Normocephalic and atraumatic.  Eyes: Conjunctivae are normal.  Neck: Neck supple.  Cardiovascular: Normal rate and regular rhythm.   No murmur heard. Pulmonary/Chest: Effort normal and breath sounds normal. No  respiratory distress.  Abdominal: Soft. There is no tenderness.  Musculoskeletal: She exhibits tenderness. She exhibits no edema.  Swollen tender right ankle. Pain with range of motion,  nv and ns intact  Neurological: She is alert.  Skin: Skin is warm and dry.  Psychiatric: She has a normal mood and affect.  Nursing note and vitals reviewed.    ED Treatments / Results  Labs (all labs ordered are listed, but only abnormal results are displayed) Labs Reviewed  CBG MONITORING, ED - Abnormal; Notable for the following:       Result Value   Glucose-Capillary 223 (*)    All other components within normal limits    EKG  EKG Interpretation None       Radiology Dg Ankle Complete Right  Result Date:  06/07/2016 CLINICAL DATA:  Ankle pain onset this morning, no known injury. EXAM: RIGHT ANKLE - COMPLETE 3+ VIEW COMPARISON:  None. FINDINGS: There is no evidence of fracture, dislocation, or joint effusion. There is no evidence of arthropathy or other focal bone abnormality. Soft tissues are unremarkable. IMPRESSION: Negative radiographs of the right ankle. Electronically Signed   By: Rubye OaksMelanie  Ehinger M.D.   On: 06/07/2016 21:41    Procedures Procedures (including critical care time)  Medications Ordered in ED Medications - No data to display   Initial Impression / Assessment and Plan / ED Course  I have reviewed the triage vital signs and the nursing notes.  Pertinent labs & imaging results that were available during my care of the patient were reviewed by me and considered in my medical decision making (see chart for details).  Clinical Course    xrays no abnormality,  Looks like ankle sprain, possible early gout,  aso for comfort,    Final Clinical Impressions(s) / ED Diagnoses   Final diagnoses:  Right ankle pain    New Prescriptions Discharge Medication List as of 06/07/2016 10:44 PM    START taking these medications   Details  diclofenac (VOLTAREN) 50 MG EC tablet Take 1 tablet (50 mg total) by mouth 2 (two) times daily., Starting Mon 06/07/2016, Print      aso See your An After Visit Summary was printed and given to the patient. for recheck in 2-3 days   Elson AreasLeslie K Clovis Mankins, PA-C 06/08/16 0057    Bethann BerkshireJoseph Zammit, MD 06/09/16 [phone]

## 2016-09-23 ENCOUNTER — Ambulatory Visit
Admission: AD | Admit: 2016-09-23 | Discharge: 2016-09-23 | Disposition: A | Discharge status: Home / Self Care | Payer: Worker's Compensation | Source: Ambulatory Visit | Attending: Family | Admitting: Family

## 2016-09-23 ENCOUNTER — Other Ambulatory Visit: Payer: Self-pay | Admitting: Family

## 2016-09-23 ENCOUNTER — Ambulatory Visit
Admission: RE | Admit: 2016-09-23 | Discharge: 2016-09-23 | Disposition: A | Discharge status: Home / Self Care | Payer: Worker's Compensation | Source: Ambulatory Visit | Attending: Family | Admitting: Family

## 2016-09-23 DIAGNOSIS — M79645 Pain in left finger(s): Secondary | ICD-10-CM | POA: Insufficient documentation

## 2016-09-23 DIAGNOSIS — T1490XA Injury, unspecified, initial encounter: Secondary | ICD-10-CM

## 2016-09-23 DIAGNOSIS — S62635A Displaced fracture of distal phalanx of left ring finger, initial encounter for closed fracture: Secondary | ICD-10-CM | POA: Diagnosis not present

## 2016-09-23 DIAGNOSIS — S67195A Crushing injury of left ring finger, initial encounter: Secondary | ICD-10-CM | POA: Insufficient documentation

## 2017-04-24 ENCOUNTER — Emergency Department (HOSPITAL_COMMUNITY)
Admission: EM | Admit: 2017-04-24 | Discharge: 2017-04-24 | Disposition: A | Discharge status: Home / Self Care | Payer: BLUE CROSS/BLUE SHIELD | Attending: Emergency Medicine | Admitting: Emergency Medicine

## 2017-04-24 ENCOUNTER — Encounter (HOSPITAL_COMMUNITY): Payer: Self-pay | Admitting: Emergency Medicine

## 2017-04-24 ENCOUNTER — Emergency Department (HOSPITAL_COMMUNITY): Payer: BLUE CROSS/BLUE SHIELD

## 2017-04-24 DIAGNOSIS — E119 Type 2 diabetes mellitus without complications: Secondary | ICD-10-CM | POA: Diagnosis not present

## 2017-04-24 DIAGNOSIS — Y929 Unspecified place or not applicable: Secondary | ICD-10-CM | POA: Insufficient documentation

## 2017-04-24 DIAGNOSIS — M25561 Pain in right knee: Secondary | ICD-10-CM

## 2017-04-24 DIAGNOSIS — Y999 Unspecified external cause status: Secondary | ICD-10-CM | POA: Diagnosis not present

## 2017-04-24 DIAGNOSIS — M1120 Other chondrocalcinosis, unspecified site: Secondary | ICD-10-CM

## 2017-04-24 DIAGNOSIS — F17210 Nicotine dependence, cigarettes, uncomplicated: Secondary | ICD-10-CM | POA: Insufficient documentation

## 2017-04-24 DIAGNOSIS — M11261 Other chondrocalcinosis, right knee: Secondary | ICD-10-CM | POA: Insufficient documentation

## 2017-04-24 DIAGNOSIS — Z79899 Other long term (current) drug therapy: Secondary | ICD-10-CM | POA: Diagnosis not present

## 2017-04-24 DIAGNOSIS — Y9301 Activity, walking, marching and hiking: Secondary | ICD-10-CM | POA: Insufficient documentation

## 2017-04-24 DIAGNOSIS — X509XXA Other and unspecified overexertion or strenuous movements or postures, initial encounter: Secondary | ICD-10-CM | POA: Insufficient documentation

## 2017-04-24 DIAGNOSIS — T1490XA Injury, unspecified, initial encounter: Secondary | ICD-10-CM

## 2017-04-24 MED ORDER — DICLOFENAC SODIUM 75 MG PO TBEC: 75.0000 mg | DELAYED_RELEASE_TABLET | Freq: Two times a day (BID) | ORAL | 0 refills | Status: DC

## 2017-04-24 MED ORDER — HYDROCODONE-ACETAMINOPHEN 5-325 MG PO TABS
1.0000 | ORAL_TABLET | Freq: Once | ORAL | Status: AC
  Administered 2017-04-24: 1 via ORAL
  Filled 2017-04-24: qty 1

## 2017-04-24 MED ORDER — IBUPROFEN 800 MG PO TABS
800.0000 mg | ORAL_TABLET | Freq: Once | ORAL | Status: AC
  Administered 2017-04-24: 800 mg via ORAL
  Filled 2017-04-24: qty 1

## 2017-04-24 NOTE — ED Triage Notes (Signed)
Pt states " I was walking and I heard a pop in the back of my knee" HX of knee surgery. No new injury.

## 2017-04-24 NOTE — Discharge Instructions (Signed)
Elevate and apply ice packs on/off to your knee.  Wear the knee brace as needed but do not wear it continuously.  Call Dr. Mort SawyersHarrison's office to arrange a follow-up appt

## 2017-04-24 NOTE — ED Provider Notes (Signed)
[facility] Provider Note   CSN: [id number] Arrival date & time: 04/24/17  1236     History   Chief Complaint Chief Complaint  Patient presents with  . Knee Pain    HPI Emily Simmons is a 41 y.o. female.  HPI   Emily Simmons is a 41 y.o. female who presents to the Emergency Department complaining of right knee pain for one day.  States that she was walking and felt a "pop" to the back of her knee and states that she is now unable to fully bend or extend the knee without pain.  Has hx of torn ACL to same knee.  Denies fall, swelling, discoloration, numbness.  Pain improves at rest.    Past Medical History:  Diagnosis Date  . Chronic back pain 2008  . Diabetes mellitus without complication (HCC)     There are no active problems to display for this patient.   Past Surgical History:  Procedure Laterality Date  . HAND SURGERY    . KNEE SURGERY    . TUBAL LIGATION      OB History    No data available       Home Medications    Prior to Admission medications   Medication Sig Start Date End Date Taking? Authorizing Provider  diclofenac (VOLTAREN) 50 MG EC tablet Take 1 tablet (50 mg total) by mouth 2 (two) times daily. 06/07/16   Elson Areas, PA-C  doxycycline (VIBRAMYCIN) 100 MG capsule Take 1 capsule (100 mg total) by mouth 2 (two) times daily. 02/20/16   Ivery Quale, PA-C  HYDROcodone-acetaminophen (NORCO/VICODIN) 5-325 MG tablet Take 1-2 tablets by mouth every 4 (four) hours as needed. 06/07/16   Elson Areas, PA-C  ibuprofen (ADVIL,MOTRIN) 600 MG tablet Take 1 tablet (600 mg total) by mouth every 6 (six) hours as needed. 10/04/15   Zadie Rhine, MD  oxyCODONE-acetaminophen (PERCOCET/ROXICET) 5-325 MG tablet Take 1 tablet by mouth every 4 (four) hours as needed for severe pain. 10/04/15   Zadie Rhine, MD    Family History Family History  Problem Relation Age of Onset  . Diabetes Mother   . Hypertension Mother   . Asthma Mother   . Diabetes  Father   . Asthma Father   . Hypertension Sister     Social History Social History  Substance Use Topics  . Smoking status: Current Every Day Smoker    Packs/day: 0.50    Types: Cigarettes  . Smokeless tobacco: Never Used  . Alcohol use Yes     Comment: occ     Allergies   Patient has no known allergies.   Review of Systems Review of Systems  Constitutional: Negative for chills and fever.  Musculoskeletal: Positive for arthralgias (right knee pain). Negative for back pain and joint swelling.  Skin: Negative for color change and wound.  Neurological: Negative for dizziness, weakness and numbness.  All other systems reviewed and are negative.    Physical Exam Updated Vital Signs BP (!) 144/74 (BP Location: Right Arm)   Pulse 79   Temp 98.7 F (37.1 C) (Oral)   Resp 18   Ht 5\' 8"  (1.727 m)   Wt 102.1 kg (225 lb)   LMP 04/06/2017   SpO2 99%   BMI 34.21 kg/m   Physical Exam  Constitutional: She is oriented to person, place, and time. She appears well-developed and well-nourished. No distress.  Cardiovascular: Normal rate, regular rhythm, normal heart sounds and intact distal pulses.  Pulmonary/Chest: Effort normal and breath sounds normal.  Musculoskeletal: She exhibits tenderness. She exhibits no edema or deformity.  ttp of the lateral right knee.  No erythema, effusion, or step-off deformity.  DP pulse brisk, distal sensation intact. Calf is soft and NT.  Neurological: She is alert and oriented to person, place, and time. She exhibits normal muscle tone. Coordination normal.  Skin: Skin is warm and dry. Capillary refill takes less than 2 seconds. No erythema.  Psychiatric: She has a normal mood and affect.  Nursing note and vitals reviewed.    ED Treatments / Results  Labs (all labs ordered are listed, but only abnormal results are displayed) Labs Reviewed - No data to display  EKG  EKG Interpretation None       Radiology Dg Knee Complete 4 Views  Right  Result Date: 04/24/2017 CLINICAL DATA:  Patient with injury to the right knee. Pain. Initial encounter. EXAM: RIGHT KNEE - COMPLETE 4+ VIEW COMPARISON:  Knee radiograph 01/24/2015. FINDINGS: Patient status post ACL repair. Chondrocalcinosis. Normal anatomic alignment. No evidence for acute fracture or dislocation. No joint effusion. IMPRESSION: No acute osseous abnormality. Chondrocalcinosis. Electronically Signed   By: Annia Beltrew  Davis M.D.   On: 04/24/2017 13:57     Procedures Procedures (including critical care time)  Medications Ordered in ED Medications - No data to display   Initial Impression / Assessment and Plan / ED Course  I have reviewed the triage vital signs and the nursing notes.  Pertinent labs & imaging results that were available during my care of the patient were reviewed by me and considered in my medical decision making (see chart for details).     XR neg for fx.  No concerning sx's for septic joint.  NV intact.    Knee sleeve applied.  Pt agrees to tx plan.  Appears stable for d/c   Final Clinical Impressions(s) / ED Diagnoses   Final diagnoses:  Injury  Acute pain of right knee  Chondrocalcinosis    New Prescriptions New Prescriptions   No medications on file     Rosey Bathriplett, [place] Sloop, PA-C 04/26/17 1939    Linwood DibblesKnapp, Jon, MD 04/27/17 [phone]

## 2017-04-27 ENCOUNTER — Ambulatory Visit (INDEPENDENT_AMBULATORY_CARE_PROVIDER_SITE_OTHER): Payer: BLUE CROSS/BLUE SHIELD | Admitting: Orthopaedic Surgery

## 2017-04-27 ENCOUNTER — Encounter: Payer: Self-pay | Admitting: Orthopaedic Surgery

## 2017-04-27 VITALS — BP 139/93 | HR 103 | Temp 99.3°F | Ht 66.0 in | Wt 219.0 lb

## 2017-04-27 DIAGNOSIS — G8929 Other chronic pain: Secondary | ICD-10-CM

## 2017-04-27 DIAGNOSIS — M25561 Pain in right knee: Secondary | ICD-10-CM | POA: Diagnosis not present

## 2017-04-27 MED ORDER — HYDROCODONE-ACETAMINOPHEN 7.5-325 MG PO TABS: ORAL_TABLET | ORAL | 0 refills | Status: DC

## 2017-04-27 NOTE — Progress Notes (Signed)
Subjective:    Patient ID: Emily Simmons, female    DOB: Jan 18, 1976, 41 y.o.   MRN: [id number]  HPI She was walking and felt a pop then severe pain in the right knee on 04-24-17.  She went to the ER.  X-rays showed old ACL surgery and chondrocalcinosis.  She continues in pain.  She is unable to extend the knee.  She has swelling and pain with any motion.  Flexion is also limited.  ACL surgery was about 20 years ago.  She has no trauma.  She has no numbness.  She has been using crutches and ice and ibuprofen.   Review of Systems  HENT: Negative for congestion.   Respiratory: Negative for cough and shortness of breath.   Cardiovascular: Negative for chest pain and leg swelling.  Endocrine: Negative for cold intolerance.  Musculoskeletal: Positive for arthralgias, back pain, gait problem and joint swelling.  Allergic/Immunologic: Negative for environmental allergies.   Past Medical History:  Diagnosis Date  . Chronic back pain 2008  . Diabetes mellitus without complication [facility](HCC)     Past Surgical History:  Procedure Laterality Date  . HAND SURGERY    . KNEE SURGERY    . TUBAL LIGATION      Current Outpatient Prescriptions on File Prior to Visit  Medication Sig Dispense Refill  . diclofenac (VOLTAREN) 75 MG EC tablet Take 1 tablet (75 mg total) by mouth 2 (two) times daily. Take with food 14 tablet 0  . doxycycline (VIBRAMYCIN) 100 MG capsule Take 1 capsule (100 mg total) by mouth 2 (two) times daily. 14 capsule 0  . ibuprofen (ADVIL,MOTRIN) 600 MG tablet Take 1 tablet (600 mg total) by mouth every 6 (six) hours as needed. 30 tablet 0   No current facility-administered medications on file prior to visit.     Social History   Social History  . Marital status: Single    Spouse name: N/A  . Number of children: N/A  . Years of education: N/A   Occupational History  . Not on file.   Social History Main Topics  . Smoking status: Current Every Day Smoker    Packs/day: 0.50      Types: Cigarettes  . Smokeless tobacco: Never Used  . Alcohol use Yes     Comment: occ  . Drug use: No  . Sexual activity: Yes    Birth control/ protection: Surgical   Other Topics Concern  . Not on file   Social History Narrative  . No narrative on file    Family History  Problem Relation Age of Onset  . Diabetes Mother   . Hypertension Mother   . Asthma Mother   . Diabetes Father   . Asthma Father   . Hypertension Sister     BP (!) 139/93   Pulse (!) 103   Temp 99.3 F (37.4 C)   Ht 5\' 6"  (1.676 m)   Wt 219 lb (99.3 kg)   LMP 04/06/2017   BMI 35.35 kg/m      Objective:   Physical Exam  Constitutional: She is oriented to person, place, and time. She appears well-developed and well-nourished.  HENT:  Head: Normocephalic and atraumatic.  Eyes: Conjunctivae and EOM are normal. Pupils are equal, round, and reactive to light.  Neck: Normal range of motion. Neck supple.  Cardiovascular: Normal rate, regular rhythm and intact distal pulses.   Pulmonary/Chest: Effort normal.  Abdominal: Soft.  Musculoskeletal: She exhibits tenderness (right knee with large effusion,  ROM 10 to 80 with pain, unable to fully extend, weakly positive Lachman but very painful to do, NV intact.  Left knee negative.  On crutches.).  Neurological: She is alert and oriented to person, place, and time. She displays normal reflexes. No cranial nerve deficit. She exhibits normal muscle tone. Coordination normal.  Skin: Skin is warm and dry.  Psychiatric: She has a normal mood and affect. Her behavior is normal. Judgment and thought content normal.  Vitals reviewed.         Assessment & Plan:   Encounter Diagnosis  Name Primary?  . Chronic pain of right knee Yes   I am concerned about new ACL tear and also meniscus tear.  She has very large effusion, unable to fully extend or flex, in pain and not improving.  I would like to get a MRI.  I have reviewed the [place] Controlled  Substance Reporting System web site prior to prescribing narcotic medicine for this patient.  Call if any problem.  Precautions discussed.  Stay out of work.   Electronically Signed Darreld Mclean, MD 7/11/20182:42 PM

## 2017-05-04 ENCOUNTER — Ambulatory Visit (HOSPITAL_COMMUNITY)
Admission: RE | Admit: 2017-05-04 | Discharge: 2017-05-04 | Disposition: A | Discharge status: Home / Self Care | Payer: BLUE CROSS/BLUE SHIELD | Source: Ambulatory Visit | Attending: Orthopaedic Surgery | Admitting: Orthopaedic Surgery

## 2017-05-04 ENCOUNTER — Encounter: Payer: Self-pay | Admitting: Orthopaedic Surgery

## 2017-05-04 ENCOUNTER — Encounter: Payer: Self-pay | Admitting: Orthopedic Surgery

## 2017-05-04 DIAGNOSIS — S83281A Other tear of lateral meniscus, current injury, right knee, initial encounter: Secondary | ICD-10-CM | POA: Diagnosis not present

## 2017-05-04 DIAGNOSIS — M25561 Pain in right knee: Secondary | ICD-10-CM | POA: Diagnosis present

## 2017-05-04 DIAGNOSIS — Z9889 Other specified postprocedural states: Secondary | ICD-10-CM | POA: Diagnosis not present

## 2017-05-04 DIAGNOSIS — G8929 Other chronic pain: Secondary | ICD-10-CM | POA: Diagnosis present

## 2017-05-04 DIAGNOSIS — X58XXXA Exposure to other specified factors, initial encounter: Secondary | ICD-10-CM | POA: Diagnosis not present

## 2017-05-10 ENCOUNTER — Ambulatory Visit (INDEPENDENT_AMBULATORY_CARE_PROVIDER_SITE_OTHER): Payer: BLUE CROSS/BLUE SHIELD | Admitting: Orthopaedic Surgery

## 2017-05-10 ENCOUNTER — Encounter: Payer: Self-pay | Admitting: Orthopaedic Surgery

## 2017-05-10 VITALS — BP 125/91 | HR 87 | Temp 97.3°F | Ht 66.0 in | Wt 219.0 lb

## 2017-05-10 DIAGNOSIS — G8929 Other chronic pain: Secondary | ICD-10-CM

## 2017-05-10 DIAGNOSIS — M23300 Other meniscus derangements, unspecified lateral meniscus, right knee: Secondary | ICD-10-CM

## 2017-05-10 DIAGNOSIS — M25561 Pain in right knee: Secondary | ICD-10-CM | POA: Diagnosis not present

## 2017-05-10 MED ORDER — HYDROCODONE-ACETAMINOPHEN 7.5-325 MG PO TABS: ORAL_TABLET | ORAL | 0 refills | Status: DC

## 2017-05-10 NOTE — Progress Notes (Signed)
Patient ON:Emily Simmons, female DOB:1975/11/15, 41 y.o. WUX:[id number]  Chief Complaint  Patient presents with  . Results    MRI RIGHT KNEE    HPI  Emily Simmons is a 41 y.o. female who has had right knee pain.  She is unable to fully extend her knee on the right.  She is post old Biochemist, clinicalACL repair. She had MRI showing: IMPRESSION: Progressive degeneration and tearing in the posterior horn and body of the lateral meniscus.  Status post ACL grafting. The graft demonstrates some degeneration but is intact. Degeneration is worse than the prior MRI.  I have told her of the findings.  I will have Dr. Romeo AppleHarrison see her and discuss surgery.  She is agreeable.  She is to continue the crutches. HPI  Body mass index is 35.35 kg/m.  ROS  Review of Systems  HENT: Negative for congestion.   Respiratory: Negative for cough and shortness of breath.   Cardiovascular: Negative for chest pain and leg swelling.  Endocrine: Negative for cold intolerance.  Musculoskeletal: Positive for arthralgias, back pain, gait problem and joint swelling.  Allergic/Immunologic: Negative for environmental allergies.    Past Medical History:  Diagnosis Date  . Chronic back pain 2008  . Diabetes mellitus without complication [facility](HCC)     Past Surgical History:  Procedure Laterality Date  . HAND SURGERY    . KNEE SURGERY    . TUBAL LIGATION      Family History  Problem Relation Age of Onset  . Diabetes Mother   . Hypertension Mother   . Asthma Mother   . Diabetes Father   . Asthma Father   . Hypertension Sister     Social History Social History  Substance Use Topics  . Smoking status: Current Every Day Smoker    Packs/day: 0.50    Types: Cigarettes  . Smokeless tobacco: Never Used  . Alcohol use Yes     Comment: occ    No Known Allergies  Current Outpatient Prescriptions  Medication Sig Dispense Refill  . diclofenac (VOLTAREN) 75 MG EC tablet Take 1 tablet (75 mg total) by mouth 2 (two) times  daily. Take with food 14 tablet 0  . doxycycline (VIBRAMYCIN) 100 MG capsule Take 1 capsule (100 mg total) by mouth 2 (two) times daily. 14 capsule 0  . HYDROcodone-acetaminophen (NORCO) 7.5-325 MG tablet One tablet every four hours as needed for pain.  Five day supply [facility]. 30 tablet 0  . ibuprofen (ADVIL,MOTRIN) 600 MG tablet Take 1 tablet (600 mg total) by mouth every 6 (six) hours as needed. 30 tablet 0   No current facility-administered medications for this visit.      Physical Exam  Blood pressure (!) 125/91, pulse 87, temperature (!) 97.3 F (36.3 C), height 5\' 6"  (1.676 m), weight 219 lb (99.3 kg).  Constitutional: overall normal hygiene, normal nutrition, well developed, normal grooming, normal body habitus. Assistive device:crutches  Musculoskeletal: gait and station Limp right, muscle tone and strength are normal, no tremors or atrophy is present.  .  Neurological: coordination overall normal.  Deep tendon reflex/nerve stretch intact.  Sensation normal.  Cranial nerves II-XII intact.   Skin:   Normal overall no scars, lesions, ulcers or rashes. No psoriasis.  Psychiatric: Alert and oriented x 3.  Recent memory intact, remote memory unclear.  Normal mood and affect. Well groomed.  Good eye contact.  Cardiovascular: overall no swelling, no varicosities, no edema bilaterally, normal temperatures of the  legs and arms, no clubbing, cyanosis and good capillary refill.  Lymphatic: palpation is normal.  The right knee is tender.  1+ effusion is present. She is unable to fully extend the knee by 5 degrees.  She has pain laterally and positive McMurray.  Drawer is about 1/2+.  NV intact.  She is on crutches.  The patient has been educated about the nature of the problem(s) and counseled on treatment options.  The patient appeared to understand what I have discussed and is in agreement with it.  Encounter Diagnoses  Name Primary?  . Degenerative tear  of lateral meniscus, right Yes  . Chronic pain of right knee     PLAN Call if any problems.  Precautions discussed.  Continue current medications.   Return to clinic To see Dr. Romeo Apple   Electronically Signed Darreld Mclean, MD 7/24/201811:44 AM

## 2017-05-10 NOTE — Addendum Note (Signed)
Addended by: Earnstine RegalKEELING, JOHN W on: 05/10/2017 11:54 AM   Modules accepted: Orders

## 2017-05-11 ENCOUNTER — Ambulatory Visit (INDEPENDENT_AMBULATORY_CARE_PROVIDER_SITE_OTHER): Payer: BLUE CROSS/BLUE SHIELD | Admitting: Orthopedic Surgery

## 2017-05-11 ENCOUNTER — Encounter: Payer: Self-pay | Admitting: Orthopedic Surgery

## 2017-05-11 VITALS — BP 128/86 | HR 116 | Temp 98.6°F | Ht 68.0 in | Wt 215.0 lb

## 2017-05-11 DIAGNOSIS — M23300 Other meniscus derangements, unspecified lateral meniscus, right knee: Secondary | ICD-10-CM | POA: Diagnosis not present

## 2017-05-11 NOTE — Patient Instructions (Addendum)
oow x 6 weeks    Meniscus Tear A meniscus tear is a knee injury in which a piece of the meniscus is torn. The meniscus is a thick, rubbery, wedge-shaped cartilage in the knee. Two menisci are located in each knee. They sit between the upper bone (femur) and lower bone (tibia) that make up the knee joint. Each meniscus acts as a shock absorber for the knee. A torn meniscus is one of the most common types of knee injuries. This injury can range from mild to severe. Surgery may be needed for a severe tear. What are the causes? This injury may be caused by any squatting, twisting, or pivoting movement. Sports-related injuries are the most common cause. These often occur from:  Running and stopping suddenly.  Changing direction.  Being tackled or knocked off your feet.  As people get older, their meniscus gets thinner and weaker. In these people, tears can happen more easily, such as from climbing stairs. What increases the risk? This injury is more likely to happen to:  People who play contact sports.  Males.  People who are 7430?42 years of age.  What are the signs or symptoms? Symptoms of this injury include:  Knee pain, especially at the side of the knee joint. You may feel pain when the injury occurs, or you may only hear a pop and feel pain later.  A feeling that your knee is clicking, catching, locking, or giving way.  Not being able to fully bend or extend your knee.  Bruising or swelling in your knee.  How is this diagnosed? This injury may be diagnosed based on your symptoms and a physical exam. The physical exam may include:  Moving your knee in different ways.  Feeling for tenderness.  Listening for a clicking sound.  Checking if your knee locks or catches.  You may also have tests, such as:  X-rays.  MRI.  A procedure to look inside your knee with a narrow surgical telescope (arthroscopy).  You may be referred to a knee specialist (orthopedic  surgeon). How is this treated? Treatment for this injury depends on the severity of the tear. Treatment for a mild tear may include:  Rest.  Medicine to reduce pain and swelling. This is usually a nonsteroidal anti-inflammatory drug (NSAID).  A knee brace or an elastic sleeve or wrap.  Using crutches or a walker to keep weight off your knee and to help you walk.  Exercises to strengthen your knee (physical therapy).  You may need surgery if you have a severe tear or if other treatments are not working. Follow these instructions at home: Managing pain and swelling  Take over-the-counter and prescription medicines only as told by your health care provider.  If directed, apply ice to the injured area: ? Put ice in a plastic bag. ? Place a towel between your skin and the bag. ? Leave the ice on for 20 minutes, 2-3 times per day.  Raise (elevate) the injured area above the level of your heart while you are sitting or lying down. Activity  Do not use the injured limb to support your body weight until your health care provider says that you can. Use crutches or a walker as told by your health care provider.  Return to your normal activities as told by your health care provider. Ask your health care provider what activities are safe for you.  Perform range-of-motion exercises only as told by your health care provider.  Begin doing exercises to  strengthen your knee and leg muscles only as told by your health care provider. After you recover, your health care provider may recommend these exercises to help prevent another injury. General instructions  Use a knee brace or elastic wrap as told by your health care provider.  Keep all follow-up visits as told by your health care provider. This is important. Contact a health care provider if:  You have a fever.  Your knee becomes red, tender, or swollen.  Your pain medicine is not helping.  Your symptoms get worse or do not improve  after 2 weeks of home care. This information is not intended to replace advice given to you by your health care provider. Make sure you discuss any questions you have with your health care provider. Document Released: 12/25/2002 Document Revised: 03/11/2016 Document Reviewed: 01/27/2015 Elsevier Interactive Patient Education  2018 [employer] Inc. Knee Arthroscopy Knee arthroscopy is a surgical procedure that is used to examine the inside of your knee joint and repair any damage. The surgeon puts a small, lighted instrument with a camera on the tip (arthroscope) through a small incision in your knee. The camera sends pictures to a monitor in the operating room. Your surgeon uses those pictures to guide the surgical instruments through other incisions to the area of damage. Knee arthroscopy can be used to treat many types of knee problems. It may be used:  To repair a torn ligament.  To repair or remove damaged tissue.  To remove a fluid-filled sac (cyst) from your knee.  Tell a health care provider about:  Any allergies you have.  All medicines you are taking, including vitamins, herbs, eye drops, creams, and over-the-counter medicines.  Any problems you or family members have had with anesthetic medicines.  Any blood disorders you have.  Any surgeries you have had.  Any medical conditions you have. What are the risks? Generally, this is a safe procedure. However, problems may occur, including:  Infection.  Bleeding.  Damage to blood vessels, nerves, or structures of your knee.  A blood clot that forms in your leg and travels to your lung.  Failure to relieve symptoms.  What happens before the procedure?  Ask your health care provider about: ? Changing or stopping your regular medicines. This is especially important if you are taking diabetes medicines or blood thinners. ? Taking medicines such as aspirin and ibuprofen. These medicines can thin your blood. Do not take these  medicines before your procedure if your health care provider instructs you not to.  Follow your health care provider's instructions about eating or drinking restrictions.  Plan to have someone take you home after the procedure.  If you go home right after the procedure, plan to have someone with you for 24 hours.  Do not drink alcohol unless your health care provider says that you can.  Do not use any tobacco products, including cigarettes, chewing tobacco, or electronic cigarettes unless your health care provider says that you can. If you need help quitting, ask your health care provider.  You may have a physical exam. What happens during the procedure?  An IV tube will be inserted into one of your veins.  You will be given one or more of the following: ? A medicine that helps you relax (sedative). ? A medicine that numbs the area (local anesthetic). ? A medicine that makes you fall asleep (general anesthetic). ? A medicine that is injected into your spine that numbs the area below and slightly above  the injection site (spinal anesthetic). ? A medicine that is injected into an area of your body that numbs everything below the injection site (regional anesthetic).  A cuff may be placed around your upper leg to slow bleeding during the procedure.  The surgeon will make a small number of incisions around your knee.  Your knee joint will be flushed and filled with a germ-free (sterile) solution.  The arthroscope will be passed through an incision into your knee joint.  More instruments will be passed through other incisions to repair your knee as needed.  The fluid will be removed from your knee.  The incisions will be closed with adhesive strips or stitches (sutures).  A bandage (dressing) will be placed over your knee. The procedure may vary among health care providers and hospitals. What happens after the procedure?  Your blood pressure, heart rate, breathing rate and blood  oxygen level will be monitored often until the medicines you were given have worn off.  You may be given medicine for pain.  You may get crutches to help you walk without using your knee to support your body weight.  You may have to wear compression stockings. These stocking help to prevent blood clots and reduce swelling in your legs. This information is not intended to replace advice given to you by your health care provider. Make sure you discuss any questions you have with your health care provider. Document Released: 10/01/2000 Document Revised: 03/11/2016 Document Reviewed: 09/30/2014 Elsevier Interactive Patient Education  2017 [employer]

## 2017-05-11 NOTE — Progress Notes (Signed)
PREOP APPT  Patient ID: Emily Simmons, female   DOB: 26-Aug-1976, 41 y.o.   MRN: [id number]  Chief Complaint  Patient presents with  . New Patient (Initial Visit)    Right knee    HPI Emily Simmons is a 41 y.o. female. Tongue had anterior cruciate ligament reconstruction as a high school student went back and played basketball her senior year  She says that the last 2 or 3 years she's had pain which began when she was walking across the floor the knee popped she had pain in the back the knee and then since that time it is just progressively gotten worse with increased pain and increased swelling in the knee is basically locked in flexion without ability to extend or flex the knee  The pain is moderate it's constant and it's a dull ache  Review of Systems Review of Systems  Respiratory: Negative for shortness of breath.   Cardiovascular: Negative for chest pain.  Musculoskeletal: Positive for gait problem.    Past Medical History:  Diagnosis Date  . Chronic back pain 2008  . Diabetes mellitus without complication [facility](HCC)     Past Surgical History:  Procedure Laterality Date  . HAND SURGERY    . KNEE SURGERY    . TUBAL LIGATION      Family History  Problem Relation Age of Onset  . Diabetes Mother   . Hypertension Mother   . Asthma Mother   . Diabetes Father   . Asthma Father   . Hypertension Sister     Social History Social History  Substance Use Topics  . Smoking status: Current Every Day Smoker    Packs/day: 0.50    Types: Cigarettes  . Smokeless tobacco: Never Used  . Alcohol use Yes     Comment: occ    No Known Allergies  Current Outpatient Prescriptions  Medication Sig Dispense Refill  . diclofenac (VOLTAREN) 75 MG EC tablet Take 1 tablet (75 mg total) by mouth 2 (two) times daily. Take with food 14 tablet 0  . doxycycline (VIBRAMYCIN) 100 MG capsule Take 1 capsule (100 mg total) by mouth 2 (two) times daily. (Patient not taking: Reported on 05/11/2017) 14  capsule 0  . HYDROcodone-acetaminophen (NORCO) 7.5-325 MG tablet One tablet every four hours as needed for pain.  Five day supply for acute pain [facility]. 30 tablet 0  . ibuprofen (ADVIL,MOTRIN) 600 MG tablet Take 1 tablet (600 mg total) by mouth every 6 (six) hours as needed. 30 tablet 0   No current facility-administered medications for this visit.        Physical Exam  BP 128/86   Pulse (!) 116   Temp 98.6 F (37 C)   Ht 5\' 8"  (1.727 m)   Wt 215 lb (97.5 kg)   BMI 32.69 kg/m   Physical Exam Gen. appearance: The patient is well-developed well-nourished grooming and hygiene are normal. There are no gross of elemental abnormalities  The patient is oriented to person place and time  The patient's mood is pleasant and affect is normal.  Ortho Exam  Gait: The patient is ambulating with crutches with a flexed knee she can't bend or straighten  Left knee Inspection and palpation revealed no tenderness or malalignment Range of motion is normal. Stability tests show ALL ligaments are intact and stable Strength gross muscle tone is normal; there is grade 5 over 5 motor strength Skin is warm dry and intact without subcutaneous nodules or erythema  Pulse and temperature are normal without edema swelling or varicosities  Right knee Inspection and palpation revealed tenderness which is diffuse no synovitis or effusion  Her knee is stuck at 40 of flexion  From what I could assess the stability tests were normal muscle tone was excellent skin was warm dry and intact without subcutaneous nodules or edema pulses normal   Data Reviewed I was able to get an MRI report and read the MRI and I indeed she does have a torn lateral meniscus is not displaced she has an anterior cruciate ligament graft which is somewhat attenuated it was read as degeneration patellofemoral joint normal medial compartment mild arthrosis lateral compartment mild arthrosis  Assessment  Locked knee  lateral meniscus tear 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  I especially reviewed with her that therapy is going to be the key to getting her knee function back  Fuller CanadaStanley Donnovan Stamour, MD 05/11/2017 5:01 PM

## 2017-05-18 ENCOUNTER — Encounter: Payer: Self-pay | Admitting: *Deleted

## 2017-05-18 NOTE — Progress Notes (Signed)
PER PATIENT PLAN SURGICAL PRE AUTHORIZATION NOT REQUIRED FOR CPT [phone] ON OUTPATIENT BASIS REFERENCE SEVINA H.

## 2017-05-19 NOTE — Patient Instructions (Signed)
Emily Simmons  05/19/2017     @PREFPERIOPPHARMACY @   Your procedure is scheduled on 05/26/2017.  [facility]  Call this number if you have problems the morning of surgery:  [phone]   Remember:  Do not eat food or drink liquids after midnight.  Take these medicines the morning of surgery with A SIP OF WATER Hydrocodone if needed   Do not wear jewelry, make-up or nail polish.  Do not wear lotions, powders, or perfumes, or deoderant.  Do not shave 48 hours prior to surgery.  Men may shave face and neck.  Do not bring valuables to the hospital.  [facility] is not responsible for any belongings or valuables.  Contacts, dentures or bridgework may not be worn into surgery.  Leave your suitcase in the car.  After surgery it may be brought to your room.  For patients admitted to the hospital, discharge time will be determined by your treatment team.  Patients discharged the day of surgery will not be allowed to drive home.   Please read over the following fact sheets that you were given. Surgical Site Infection Prevention and Anesthesia Post-op Instructions     PATIENT INSTRUCTIONS POST-ANESTHESIA  IMMEDIATELY FOLLOWING SURGERY:  Do not drive or operate machinery for the first twenty four hours after surgery.  Do not make any important decisions for twenty four hours after surgery or while taking narcotic pain medications or sedatives.  If you develop intractable nausea and vomiting or a severe headache please notify your doctor immediately.  FOLLOW-UP:  Please make an appointment with your surgeon as instructed. You do not need to follow up with anesthesia unless specifically instructed to do so.  WOUND CARE INSTRUCTIONS (if applicable):  Keep a dry clean dressing on the anesthesia/puncture wound site if there is drainage.  Once the wound has quit draining you may leave it open to air.  Generally you should leave the bandage intact for twenty four hours  unless there is drainage.  If the epidural site drains for more than 36-48 hours please call the anesthesia department.  QUESTIONS?:  Please feel free to call your physician or the hospital operator if you have any questions, and they will be happy to assist you.      Knee Arthroscopy Knee arthroscopy is a surgical procedure that is used to examine the inside of your knee joint and repair any damage. The surgeon puts a small, lighted instrument with a camera on the tip (arthroscope) through a small incision in your knee. The camera sends pictures to a monitor in the operating room. Your surgeon uses those pictures to guide the surgical instruments through other incisions to the area of damage. Knee arthroscopy can be used to treat many types of knee problems. It may be used:  To repair a torn ligament.  To repair or remove damaged tissue.  To remove a fluid-filled sac (cyst) from your knee.  Tell a health care provider about:  Any allergies you have.  All medicines you are taking, including vitamins, herbs, eye drops, creams, and over-the-counter medicines.  Any problems you or family members have had with anesthetic medicines.  Any blood disorders you have.  Any surgeries you have had.  Any medical conditions you have. What are the risks? Generally, this is a safe procedure. However, problems may occur, including:  Infection.  Bleeding.  Damage to blood vessels, nerves, or structures of your knee.  A blood clot that  forms in your leg and travels to your lung.  Failure to relieve symptoms.  What happens before the procedure?  Ask your health care provider about: ? Changing or stopping your regular medicines. This is especially important if you are taking diabetes medicines or blood thinners. ? Taking medicines such as aspirin and ibuprofen. These medicines can thin your blood. Do not take these medicines before your procedure if your health care provider instructs you not  to.  Follow your health care provider's instructions about eating or drinking restrictions.  Plan to have someone take you home after the procedure.  If you go home right after the procedure, plan to have someone with you for 24 hours.  Do not drink alcohol unless your health care provider says that you can.  Do not use any tobacco products, including cigarettes, chewing tobacco, or electronic cigarettes unless your health care provider says that you can. If you need help quitting, ask your health care provider.  You may have a physical exam. What happens during the procedure?  An IV tube will be inserted into one of your veins.  You will be given one or more of the following: ? A medicine that helps you relax (sedative). ? A medicine that numbs the area (local anesthetic). ? A medicine that makes you fall asleep (general anesthetic). ? A medicine that is injected into your spine that numbs the area below and slightly above the injection site (spinal anesthetic). ? A medicine that is injected into an area of your body that numbs everything below the injection site (regional anesthetic).  A cuff may be placed around your upper leg to slow bleeding during the procedure.  The surgeon will make a small number of incisions around your knee.  Your knee joint will be flushed and filled with a germ-free (sterile) solution.  The arthroscope will be passed through an incision into your knee joint.  More instruments will be passed through other incisions to repair your knee as needed.  The fluid will be removed from your knee.  The incisions will be closed with adhesive strips or stitches (sutures).  A bandage (dressing) will be placed over your knee. The procedure may vary among health care providers and hospitals. What happens after the procedure?  Your blood pressure, heart rate, breathing rate and blood oxygen level will be monitored often until the medicines you were given have  worn off.  You may be given medicine for pain.  You may get crutches to help you walk without using your knee to support your body weight.  You may have to wear compression stockings. These stocking help to prevent blood clots and reduce swelling in your legs. This information is not intended to replace advice given to you by your health care provider. Make sure you discuss any questions you have with your health care provider. Document Released: 10/01/2000 Document Revised: 03/11/2016 Document Reviewed: 09/30/2014 Elsevier Interactive Patient Education  2017 [employer]

## 2017-05-24 ENCOUNTER — Encounter (HOSPITAL_COMMUNITY): Payer: Self-pay

## 2017-05-24 ENCOUNTER — Encounter (HOSPITAL_COMMUNITY)
Admission: RE | Admit: 2017-05-24 | Discharge: 2017-05-24 | Disposition: A | Discharge status: Home / Self Care | Payer: BLUE CROSS/BLUE SHIELD | Source: Ambulatory Visit | Attending: Orthopedic Surgery | Admitting: Orthopedic Surgery

## 2017-05-24 ENCOUNTER — Encounter: Payer: Self-pay | Admitting: Orthopedic Surgery

## 2017-05-24 DIAGNOSIS — K219 Gastro-esophageal reflux disease without esophagitis: Secondary | ICD-10-CM | POA: Diagnosis not present

## 2017-05-24 DIAGNOSIS — M549 Dorsalgia, unspecified: Secondary | ICD-10-CM | POA: Diagnosis not present

## 2017-05-24 DIAGNOSIS — Z791 Long term (current) use of non-steroidal anti-inflammatories (NSAID): Secondary | ICD-10-CM | POA: Diagnosis not present

## 2017-05-24 DIAGNOSIS — F17210 Nicotine dependence, cigarettes, uncomplicated: Secondary | ICD-10-CM | POA: Diagnosis not present

## 2017-05-24 DIAGNOSIS — S83281A Other tear of lateral meniscus, current injury, right knee, initial encounter: Secondary | ICD-10-CM | POA: Diagnosis present

## 2017-05-24 DIAGNOSIS — M23351 Other meniscus derangements, posterior horn of lateral meniscus, right knee: Secondary | ICD-10-CM | POA: Diagnosis not present

## 2017-05-24 DIAGNOSIS — E119 Type 2 diabetes mellitus without complications: Secondary | ICD-10-CM | POA: Diagnosis not present

## 2017-05-24 DIAGNOSIS — G8929 Other chronic pain: Secondary | ICD-10-CM | POA: Diagnosis not present

## 2017-05-24 HISTORY — DX: Gastro-esophageal reflux disease without esophagitis: K21.9

## 2017-05-24 HISTORY — DX: Unspecified osteoarthritis, unspecified site: M19.90

## 2017-05-24 LAB — CBC WITH DIFFERENTIAL/PLATELET
Basophils Absolute: 0.1 10*3/uL (ref 0.0–0.1)
Basophils Relative: 0 %
EOS PCT: 3 %
Eosinophils Absolute: 0.3 10*3/uL (ref 0.0–0.7)
HEMATOCRIT: 39.9 % (ref 36.0–46.0)
Hemoglobin: 12.9 g/dL (ref 12.0–15.0)
LYMPHS PCT: 30 %
Lymphs Abs: 3.7 10*3/uL (ref 0.7–4.0)
MCH: 25.7 pg — AB (ref 26.0–34.0)
MCHC: 32.3 g/dL (ref 30.0–36.0)
MCV: 79.5 fL (ref 78.0–100.0)
MONO ABS: 0.9 10*3/uL (ref 0.1–1.0)
MONOS PCT: 7 %
NEUTROS ABS: 7.4 10*3/uL (ref 1.7–7.7)
Neutrophils Relative %: 60 %
PLATELETS: 300 10*3/uL (ref 150–400)
RBC: 5.02 MIL/uL (ref 3.87–5.11)
RDW: 15 % (ref 11.5–15.5)
WBC: 12.4 10*3/uL — ABNORMAL HIGH (ref 4.0–10.5)

## 2017-05-24 LAB — BASIC METABOLIC PANEL
Anion gap: 8 (ref 5–15)
BUN: 11 mg/dL (ref 6–20)
CO2: 23 mmol/L (ref 22–32)
CREATININE: 0.64 mg/dL (ref 0.44–1.00)
Calcium: 9.3 mg/dL (ref 8.9–10.3)
Chloride: 105 mmol/L (ref 101–111)
GFR calc Af Amer: >60 mL/min (ref >60)
GFR calc non Af Amer: >60 mL/min (ref >60)
GLUCOSE: 113 mg/dL — AB (ref 65–99)
Potassium: 3.9 mmol/L (ref 3.5–5.1)
Sodium: 136 mmol/L (ref 135–145)

## 2017-05-24 LAB — SURGICAL PCR SCREEN
MRSA, PCR: NEGATIVE
Staphylococcus aureus: POSITIVE — AB

## 2017-05-24 LAB — HCG, SERUM, QUALITATIVE: Preg, Serum: NEGATIVE

## 2017-05-24 MED ORDER — MUPIROCIN 2 % EX OINT
TOPICAL_OINTMENT | CUTANEOUS | Status: AC
  Filled 2017-05-24: qty 22

## 2017-05-25 NOTE — H&P (Signed)
History and physical for outpatient surgery  Chief complaint Right knee pain and stiffness       HPI Emily Simmons is a 41 y.o. female. She had anterior cruciate ligament reconstruction as a high school student went back and played basketball her senior year   She says that the last 2 or 3 years she's had pain which began when she was walking across the floor the knee popped she had pain in the back the knee and then since that time it is just progressively gotten worse with increased pain and increased swelling and the knee is basically locked in flexion without ability to extend or flex the knee   The pain is moderate it's constant and it's a dull ache   Review of Systems Review of Systems  Respiratory: Negative for shortness of breath.   Cardiovascular: Negative for chest pain.  Musculoskeletal: Positive for gait problem.  The remaining systems were reviewed and were normal       Past Medical History:  Diagnosis Date  . Chronic back pain 2008  . Diabetes mellitus without complication [facility](HCC)             Past Surgical History:  Procedure Laterality Date  . HAND SURGERY      . KNEE SURGERY      . TUBAL LIGATION               Family History  Problem Relation Age of Onset  . Diabetes Mother    . Hypertension Mother    . Asthma Mother    . Diabetes Father    . Asthma Father    . Hypertension Sister        Social History        Social History  Substance Use Topics  . Smoking status: Current Every Day Smoker      Packs/day: 0.50      Types: Cigarettes  . Smokeless tobacco: Never Used  . Alcohol use Yes         Comment: occ      No Known Allergies   Current Outpatient Prescriptions  Medication Sig Dispense Refill  . diclofenac (VOLTAREN) 75 MG EC tablet Take 1 tablet (75 mg total) by mouth 2 (two) times daily. Take with food 14 tablet 0  . doxycycline (VIBRAMYCIN) 100 MG capsule Take 1 capsule (100 mg total) by mouth 2 (two) times daily. (Patient not taking:  Reported on 05/11/2017) 14 capsule 0  . HYDROcodone-acetaminophen (NORCO) 7.5-325 MG tablet One tablet every four hours as needed for pain.  [facility] day [facility]. 30 tablet 0  . ibuprofen (ADVIL,MOTRIN) 600 MG tablet Take 1 tablet (600 mg total) by mouth every 6 (six) hours as needed. 30 tablet 0    No current facility-administered medications for this visit.             Physical Exam   BP 128/86   Pulse (!) 116   Temp 98.6 F (37 C)   Ht 5\' 8"  (1.727 m)   Wt 215 lb (97.5 kg)   BMI 32.69 kg/m    Physical Exam Gen. appearance: The patient is well-developed well-nourished grooming and hygiene are normal. There are no gross of elemental abnormalities   The patient is oriented to person place and time   The patient's mood is pleasant and affect is normal.   Ortho Exam   Gait: The patient is ambulating with crutches with a flexed knee she  can't bend or straighten   Left knee Inspection and palpation revealed no tenderness or malalignment Range of motion is normal. Stability tests show ALL ligaments are intact and stable Strength gross muscle tone is normal; there is grade 5 over 5 motor strength Skin is warm dry and intact without subcutaneous nodules or erythema Pulse and temperature are normal without edema swelling or varicosities   Right knee Inspection and palpation revealed tenderness which is diffuse no synovitis or effusion   Her knee is stuck at 40 of flexion   From what I could assess the stability tests were normal muscle tone was excellent skin was warm dry and intact without subcutaneous nodules or edema pulses normal     Data Reviewed I was able to get an MRI report and read the MRI and I indeed she does have a torn lateral meniscus is not displaced she has an anterior cruciate ligament graft which is somewhat attenuated it was read as degeneration patellofemoral joint normal medial compartment mild arthrosis lateral compartment  mild arthrosis   Assessment   Locked knee,  lateral meniscus tear,  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   I especially reviewed with her that therapy is going to be the key to getting her knee function back

## 2017-05-26 ENCOUNTER — Encounter (HOSPITAL_COMMUNITY)
Admission: RE | Disposition: A | Discharge status: Home / Self Care | Payer: Self-pay | Source: Ambulatory Visit | Attending: Orthopedic Surgery

## 2017-05-26 ENCOUNTER — Encounter (HOSPITAL_COMMUNITY): Payer: Self-pay

## 2017-05-26 ENCOUNTER — Ambulatory Visit (HOSPITAL_COMMUNITY)
Admission: RE | Admit: 2017-05-26 | Discharge: 2017-05-26 | Disposition: A | Discharge status: Home / Self Care | Payer: BLUE CROSS/BLUE SHIELD | Source: Ambulatory Visit | Attending: Orthopedic Surgery | Admitting: Orthopedic Surgery

## 2017-05-26 ENCOUNTER — Ambulatory Visit (HOSPITAL_COMMUNITY): Payer: BLUE CROSS/BLUE SHIELD | Admitting: Anesthesiology

## 2017-05-26 DIAGNOSIS — F17210 Nicotine dependence, cigarettes, uncomplicated: Secondary | ICD-10-CM | POA: Insufficient documentation

## 2017-05-26 DIAGNOSIS — M23351 Other meniscus derangements, posterior horn of lateral meniscus, right knee: Secondary | ICD-10-CM | POA: Diagnosis not present

## 2017-05-26 DIAGNOSIS — G8929 Other chronic pain: Secondary | ICD-10-CM | POA: Insufficient documentation

## 2017-05-26 DIAGNOSIS — K219 Gastro-esophageal reflux disease without esophagitis: Secondary | ICD-10-CM | POA: Insufficient documentation

## 2017-05-26 DIAGNOSIS — Z9889 Other specified postprocedural states: Secondary | ICD-10-CM

## 2017-05-26 DIAGNOSIS — M549 Dorsalgia, unspecified: Secondary | ICD-10-CM | POA: Insufficient documentation

## 2017-05-26 DIAGNOSIS — Z791 Long term (current) use of non-steroidal anti-inflammatories (NSAID): Secondary | ICD-10-CM | POA: Insufficient documentation

## 2017-05-26 DIAGNOSIS — E119 Type 2 diabetes mellitus without complications: Secondary | ICD-10-CM | POA: Insufficient documentation

## 2017-05-26 HISTORY — PX: KNEE ARTHROSCOPY WITH LATERAL MENISECTOMY: SHX6193

## 2017-05-26 LAB — GLUCOSE, CAPILLARY: Glucose-Capillary: 102 mg/dL — ABNORMAL HIGH (ref 65–99)

## 2017-05-26 SURGERY — ARTHROSCOPY, KNEE, WITH LATERAL MENISCECTOMY
Anesthesia: General | Laterality: Right

## 2017-05-26 MED ORDER — MIDAZOLAM HCL 2 MG/2ML IJ SOLN
1.0000 mg | INTRAMUSCULAR | Status: AC
  Administered 2017-05-26: 2 mg via INTRAVENOUS

## 2017-05-26 MED ORDER — 0.9 % SODIUM CHLORIDE (POUR BTL) OPTIME
TOPICAL | Status: DC | PRN
  Administered 2017-05-26: 1000 mL

## 2017-05-26 MED ORDER — FENTANYL CITRATE (PF) 100 MCG/2ML IJ SOLN
25.0000 ug | INTRAMUSCULAR | Status: DC | PRN
  Administered 2017-05-26 (×2): 50 ug via INTRAVENOUS

## 2017-05-26 MED ORDER — BUPIVACAINE-EPINEPHRINE (PF) 0.5% -1:200000 IJ SOLN
INTRAMUSCULAR | Status: DC | PRN
  Administered 2017-05-26 (×2): 30 mL via PERINEURAL

## 2017-05-26 MED ORDER — IBUPROFEN 800 MG PO TABS
ORAL_TABLET | ORAL | Status: AC
  Filled 2017-05-26: qty 1

## 2017-05-26 MED ORDER — SODIUM CHLORIDE 0.9 % IR SOLN
Status: DC | PRN
  Administered 2017-05-26: 12:00:00

## 2017-05-26 MED ORDER — PROPOFOL 10 MG/ML IV BOLUS
INTRAVENOUS | Status: AC
  Filled 2017-05-26: qty 40

## 2017-05-26 MED ORDER — IBUPROFEN 800 MG PO TABS
800.0000 mg | ORAL_TABLET | Freq: Once | ORAL | Status: AC
  Administered 2017-05-26: 800 mg via ORAL

## 2017-05-26 MED ORDER — CEFAZOLIN SODIUM-DEXTROSE 2-4 GM/100ML-% IV SOLN
2.0000 g | INTRAVENOUS | Status: AC
  Administered 2017-05-26: 2 g via INTRAVENOUS
  Filled 2017-05-26: qty 100

## 2017-05-26 MED ORDER — MIDAZOLAM HCL 2 MG/2ML IJ SOLN
INTRAMUSCULAR | Status: AC
  Filled 2017-05-26: qty 2

## 2017-05-26 MED ORDER — FENTANYL CITRATE (PF) 100 MCG/2ML IJ SOLN
INTRAMUSCULAR | Status: AC
  Filled 2017-05-26: qty 2

## 2017-05-26 MED ORDER — BUPIVACAINE-EPINEPHRINE (PF) 0.5% -1:200000 IJ SOLN
INTRAMUSCULAR | Status: AC
  Filled 2017-05-26: qty 60

## 2017-05-26 MED ORDER — KETOROLAC TROMETHAMINE 30 MG/ML IJ SOLN
30.0000 mg | Freq: Once | INTRAMUSCULAR | Status: AC
  Administered 2017-05-26: 30 mg via INTRAVENOUS

## 2017-05-26 MED ORDER — SUCCINYLCHOLINE CHLORIDE 20 MG/ML IJ SOLN
INTRAMUSCULAR | Status: AC
  Filled 2017-05-26: qty 1

## 2017-05-26 MED ORDER — HYDROCODONE-ACETAMINOPHEN 7.5-325 MG PO TABS
ORAL_TABLET | ORAL | Status: AC
  Filled 2017-05-26: qty 1

## 2017-05-26 MED ORDER — ONDANSETRON HCL 4 MG/2ML IJ SOLN
4.0000 mg | Freq: Once | INTRAMUSCULAR | Status: AC
  Administered 2017-05-26: 4 mg via INTRAVENOUS

## 2017-05-26 MED ORDER — HYDROCODONE-ACETAMINOPHEN 7.5-325 MG PO TABS
1.0000 | ORAL_TABLET | Freq: Once | ORAL | Status: AC
  Administered 2017-05-26: 1 via ORAL

## 2017-05-26 MED ORDER — LACTATED RINGERS IV SOLN
INTRAVENOUS | Status: DC
  Administered 2017-05-26 (×2): via INTRAVENOUS

## 2017-05-26 MED ORDER — EPINEPHRINE PF 1 MG/ML IJ SOLN
INTRAMUSCULAR | Status: DC | PRN
  Administered 2017-05-26: 3000 mL

## 2017-05-26 MED ORDER — CHLORHEXIDINE GLUCONATE 4 % EX LIQD: 60.0000 mL | Freq: Once | CUTANEOUS | Status: DC

## 2017-05-26 MED ORDER — FENTANYL CITRATE (PF) 100 MCG/2ML IJ SOLN
INTRAMUSCULAR | Status: DC | PRN
  Administered 2017-05-26 (×2): 50 ug via INTRAVENOUS

## 2017-05-26 MED ORDER — OXYCODONE-ACETAMINOPHEN 5-325 MG PO TABS: 1.0000 | ORAL_TABLET | ORAL | 0 refills | Status: DC | PRN

## 2017-05-26 MED ORDER — ONDANSETRON HCL 4 MG/2ML IJ SOLN
INTRAMUSCULAR | Status: AC
Start: 2017-05-26 — End: ?
  Filled 2017-05-26: qty 2

## 2017-05-26 MED ORDER — LIDOCAINE HCL (CARDIAC) 10 MG/ML IV SOLN
INTRAVENOUS | Status: DC | PRN
  Administered 2017-05-26: 50 mg via INTRAVENOUS

## 2017-05-26 MED ORDER — PROPOFOL 10 MG/ML IV BOLUS
INTRAVENOUS | Status: DC | PRN
  Administered 2017-05-26: 180 mg via INTRAVENOUS

## 2017-05-26 MED ORDER — KETOROLAC TROMETHAMINE 30 MG/ML IJ SOLN
INTRAMUSCULAR | Status: AC
  Filled 2017-05-26: qty 1

## 2017-05-26 MED ORDER — LIDOCAINE HCL (PF) 1 % IJ SOLN
INTRAMUSCULAR | Status: AC
  Filled 2017-05-26: qty 5

## 2017-05-26 SURGICAL SUPPLY — 57 items
ARTHROWAND PARAGON T2 (SURGICAL WAND)
BAG HAMPER (MISCELLANEOUS) ×3 IMPLANT
BANDAGE ELASTIC 6 LF NS (GAUZE/BANDAGES/DRESSINGS) ×3 IMPLANT
BIT DRILL 2.0MX128MM (BIT) ×3 IMPLANT
BLADE 11 SAFETY STRL DISP (BLADE) ×3 IMPLANT
BLADE AGGRESSIVE PLUS 4.0 (BLADE) ×3 IMPLANT
BNDG CMPR MED 5X6 ELC HKLP NS (GAUZE/BANDAGES/DRESSINGS) ×1
CHLORAPREP W/TINT 26ML (MISCELLANEOUS) ×6 IMPLANT
CLOTH BEACON ORANGE TIMEOUT ST (SAFETY) ×3 IMPLANT
COOLER CRYO IC GRAV AND TUBE (ORTHOPEDIC SUPPLIES) ×3 IMPLANT
CUFF CRYO KNEE LG 20X31 COOLER (ORTHOPEDIC SUPPLIES) ×2 IMPLANT
CUFF CRYO KNEE18X23 MED (MISCELLANEOUS) IMPLANT
CUFF TOURNIQUET SINGLE 34IN LL (TOURNIQUET CUFF) ×2 IMPLANT
CUTTER ANGLED DBL BITE 4.5 (BURR) IMPLANT
DECANTER SPIKE VIAL GLASS SM (MISCELLANEOUS) ×6 IMPLANT
GAUZE SPONGE 4X4 12PLY STRL (GAUZE/BANDAGES/DRESSINGS) ×3 IMPLANT
GAUZE SPONGE 4X4 16PLY XRAY LF (GAUZE/BANDAGES/DRESSINGS) ×3 IMPLANT
GAUZE XEROFORM 5X9 LF (GAUZE/BANDAGES/DRESSINGS) ×3 IMPLANT
GLOVE BIO SURGEON STRL SZ7 (GLOVE) ×4 IMPLANT
GLOVE BIOGEL PI IND STRL 7.0 (GLOVE) ×1 IMPLANT
GLOVE BIOGEL PI INDICATOR 7.0 (GLOVE) ×2
GLOVE SKINSENSE NS SZ8.0 LF (GLOVE) ×2
GLOVE SKINSENSE STRL SZ8.0 LF (GLOVE) ×1 IMPLANT
GLOVE SS N UNI LF 8.5 STRL (GLOVE) ×3 IMPLANT
GOWN STRL REUS W/TWL LRG LVL3 (GOWN DISPOSABLE) ×3 IMPLANT
GOWN STRL REUS W/TWL XL LVL3 (GOWN DISPOSABLE) ×3 IMPLANT
HLDR LEG FOAM (MISCELLANEOUS) ×1 IMPLANT
IV NS IRRIG 3000ML ARTHROMATIC (IV SOLUTION) ×6 IMPLANT
KIT BLADEGUARD II DBL (SET/KITS/TRAYS/PACK) ×3 IMPLANT
KIT ROOM TURNOVER AP CYSTO (KITS) ×3 IMPLANT
LEG HOLDER FOAM (MISCELLANEOUS) ×2
MANIFOLD NEPTUNE II (INSTRUMENTS) ×3 IMPLANT
MARKER SKIN DUAL TIP RULER LAB (MISCELLANEOUS) ×3 IMPLANT
NDL HYPO 18GX1.5 BLUNT FILL (NEEDLE) ×1 IMPLANT
NDL HYPO 21X1.5 SAFETY (NEEDLE) ×1 IMPLANT
NDL SPNL 18GX3.5 QUINCKE PK (NEEDLE) ×1 IMPLANT
NEEDLE HYPO 18GX1.5 BLUNT FILL (NEEDLE) ×3 IMPLANT
NEEDLE HYPO 21X1.5 SAFETY (NEEDLE) ×3 IMPLANT
NEEDLE SPNL 18GX3.5 QUINCKE PK (NEEDLE) ×3 IMPLANT
NS IRRIG 1000ML POUR BTL (IV SOLUTION) ×3 IMPLANT
PACK ARTHRO LIMB DRAPE STRL (MISCELLANEOUS) ×3 IMPLANT
PAD ABD 5X9 TENDERSORB (GAUZE/BANDAGES/DRESSINGS) ×3 IMPLANT
PAD ARMBOARD 7.5X6 YLW CONV (MISCELLANEOUS) ×3 IMPLANT
PADDING CAST COTTON 6X4 STRL (CAST SUPPLIES) ×3 IMPLANT
PADDING WEBRIL 6 STERILE (GAUZE/BANDAGES/DRESSINGS) ×2 IMPLANT
PROBE BIPOLAR 50 DEGREE SUCT (MISCELLANEOUS) ×3 IMPLANT
PROBE BIPOLAR ATHRO 135MM 90D (MISCELLANEOUS) ×3 IMPLANT
SET ARTHROSCOPY INST (INSTRUMENTS) ×3 IMPLANT
SET ARTHROSCOPY PUMP TUBE (IRRIGATION / IRRIGATOR) ×3 IMPLANT
SET BASIN LINEN APH (SET/KITS/TRAYS/PACK) ×3 IMPLANT
SUT ETHILON 3 0 FSL (SUTURE) ×3 IMPLANT
SYR 30ML LL (SYRINGE) ×3 IMPLANT
SYRINGE 10CC LL (SYRINGE) ×3 IMPLANT
TUBE CONNECTING 12'X1/4 (SUCTIONS) ×3
TUBE CONNECTING 12X1/4 (SUCTIONS) ×6 IMPLANT
WAND ARTHRO PARAGON T2 (SURGICAL WAND) IMPLANT
WATER STERILE IRR 1000ML POUR (IV SOLUTION) ×3 IMPLANT

## 2017-05-26 NOTE — Transfer of Care (Signed)
Immediate Anesthesia Transfer of Care Note  Patient: Emily Simmons  Procedure(s) Performed: Procedure(s): KNEE ARTHROSCOPY WITH LATERAL MENISECTOMY (Right)  Patient Location: PACU  Anesthesia Type:General  Level of Consciousness: awake, alert , oriented and patient cooperative  Airway & Oxygen Therapy: Patient Spontanous Breathing  Post-op Assessment: Report given to RN and Post -op Vital signs reviewed and stable  Post vital signs: Reviewed and stable  Last Vitals:  Vitals:   05/26/17 1015 05/26/17 1253  BP:  105/80  Pulse:  73  Resp: 17 15  Temp:  37.6 C  SpO2: 98% 100%    Last Pain:  Vitals:   05/26/17 1253  TempSrc: Oral         Complications: No apparent anesthesia complications

## 2017-05-26 NOTE — Op Note (Signed)
05/26/2017  12:54 PM  PATIENT:  Emily Simmons  41 y.o. female  PRE-OPERATIVE DIAGNOSIS:  right lateral meniscus tear  POST-OPERATIVE DIAGNOSIS:  right lateral meniscus tear  PROCEDURE:  Procedure(s): KNEE ARTHROSCOPY WITH LATERAL MENISECTOMY (Right)  SURGEON:  Surgeon(s) and Role:    Vickki Hearing* Harrison, Stanley E, MD - Primary  PHYSICIAN ASSISTANT:   ASSISTANTS: none   ANESTHESIA:   general  EBL:  Total I/O In: 1000 [I.V.:1000] Out: -   BLOOD ADMINISTERED:none  DRAINS: none   LOCAL MEDICATIONS USED:  MARCAINE   , Amount: 60 ml and OTHER epi  SPECIMEN:  No Specimen  DISPOSITION OF SPECIMEN:  N/A  COUNTS:  YES  TOURNIQUET:    DICTATION: .Dragon Dictation  PLAN OF CARE: Discharge to home after PACU  PATIENT DISPOSITION:  PACU - hemodynamically stable.   Delay start of Pharmacological VTE agent (>24hrs) due to surgical blood loss or risk of bleeding: not applicable  29881   Knee arthroscopy dictation  The patient was identified in the preoperative holding area using 2 approved identification mechanisms. The chart was reviewed and updated. The surgical site was confirmed as right knee and marked with an indelible marker.  The patient was taken to the operating room for anesthesia. After successful general LM a anesthesia, Ancef 2 g was used as IV antibiotics.  The patient was placed in the supine position with the (right) the operative extremity in an arthroscopic leg holder and the opposite extremity in a padded leg holder.  The timeout was executed.  A lateral portal was established with an 11 blade and the scope was introduced into the joint. A diagnostic arthroscopy was performed in circumferential manner examining the entire knee joint. A medial portal was established and the diagnostic arthroscopy was repeated using a probe to palpate intra-articular structures as they were encountered.   The operative findings are   Medial normal medial meniscus, articular  cartilage showed no gross abnormalities Lateral torn posterior horn lateral meniscus with flipped bucket-handle-like fragment into the notch Patellofemoral normal Notch anterior cruciate ligament intact and firm   The lateral meniscus was resected using a duckbill forceps. The meniscal fragments were removed with a motorized shaver.   I had to place a scope in the medial compartment to assist with resecting the torn meniscal fragments it was a complex tear with multiple longitudinal tears.   The meniscus was balanced with a combination of a motorized shaver and a 50 ArthroCare wand until a stable rim was obtained.   The arthroscopic pump was placed on the wash mode and any excess debris was removed from the joint using suction.  60 cc of Marcaine with epinephrine was injected through the arthroscope.  The portals were closed with 3-0 nylon suture.  A sterile bandage, Ace wrap and Cryo/Cuff was placed and the Cryo/Cuff was activated. The patient was taken to the recovery room in stable condition.

## 2017-05-26 NOTE — Anesthesia Postprocedure Evaluation (Signed)
Anesthesia Post Note  Patient: Emily Simmons  Procedure(s) Performed: Procedure(s) (LRB): KNEE ARTHROSCOPY WITH LATERAL MENISECTOMY (Right)  Patient location during evaluation: PACU Anesthesia Type: General Level of consciousness: awake and alert, oriented and patient cooperative Pain management: pain level controlled Vital Signs Assessment: post-procedure vital signs reviewed and stable Respiratory status: spontaneous breathing and respiratory function stable Cardiovascular status: blood pressure returned to baseline and stable Postop Assessment: no headache, adequate PO intake and no signs of nausea or vomiting Anesthetic complications: no     Last Vitals:  Vitals:   05/26/17 1015 05/26/17 1253  BP:  105/80  Pulse:  73  Resp: 17 15  Temp:  37.6 C  SpO2: 98% 100%    Last Pain:  Vitals:   05/26/17 1253  TempSrc: Oral                 Francesca Strome

## 2017-05-26 NOTE — Discharge Instructions (Signed)

## 2017-05-26 NOTE — Brief Op Note (Addendum)
05/26/2017  12:54 PM  PATIENT:  Emily Simmons  41 y.o. female  PRE-OPERATIVE DIAGNOSIS:  right lateral meniscus tear  POST-OPERATIVE DIAGNOSIS:  right lateral meniscus tear  PROCEDURE:  Procedure(s): KNEE ARTHROSCOPY WITH LATERAL MENISECTOMY (Right)  SURGEON:  Surgeon(s) and Role:    * Harrison, Stanley E, MD - Primary  PHYSICIAN ASSISTANT:   ASSISTANTS: none   ANESTHESIA:   general  EBL:  Total I/O In: 1000 [I.V.:1000] Out: -   BLOOD ADMINISTERED:none  DRAINS: none   LOCAL MEDICATIONS USED:  MARCAINE   , Amount: 60 ml and OTHER epi  SPECIMEN:  No Specimen  DISPOSITION OF SPECIMEN:  N/A  COUNTS:  YES  TOURNIQUET:    DICTATION: .Dragon Dictation  PLAN OF CARE: Discharge to home after PACU  PATIENT DISPOSITION:  PACU - hemodynamically stable.   Delay start of Pharmacological VTE agent (>24hrs) due to surgical blood loss or risk of bleeding: not applicable  29881   Knee arthroscopy dictation  The patient was identified in the preoperative holding area using 2 approved identification mechanisms. The chart was reviewed and updated. The surgical site was confirmed as right knee and marked with an indelible marker.  The patient was taken to the operating room for anesthesia. After successful general LM a anesthesia, Ancef 2 g was used as IV antibiotics.  The patient was placed in the supine position with the (right) the operative extremity in an arthroscopic leg holder and the opposite extremity in a padded leg holder.  The timeout was executed.  A lateral portal was established with an 11 blade and the scope was introduced into the joint. A diagnostic arthroscopy was performed in circumferential manner examining the entire knee joint. A medial portal was established and the diagnostic arthroscopy was repeated using a probe to palpate intra-articular structures as they were encountered.   The operative findings are   Medial normal medial meniscus, articular  cartilage showed no gross abnormalities Lateral torn posterior horn lateral meniscus with flipped bucket-handle-like fragment into the notch Patellofemoral normal Notch anterior cruciate ligament intact and firm   The lateral meniscus was resected using a duckbill forceps. The meniscal fragments were removed with a motorized shaver.   I had to place a scope in the medial compartment to assist with resecting the torn meniscal fragments it was a complex tear with multiple longitudinal tears.   The meniscus was balanced with a combination of a motorized shaver and a 50 ArthroCare wand until a stable rim was obtained.   The arthroscopic pump was placed on the wash mode and any excess debris was removed from the joint using suction.  60 cc of Marcaine with epinephrine was injected through the arthroscope.  The portals were closed with 3-0 nylon suture.  A sterile bandage, Ace wrap and Cryo/Cuff was placed and the Cryo/Cuff was activated. The patient was taken to the recovery room in stable condition.   

## 2017-05-26 NOTE — Anesthesia Procedure Notes (Signed)
Procedure Name: LMA Insertion Date/Time: 05/26/2017 11:45 AM Performed by: Pernell DupreADAMS, AMY A Pre-anesthesia Checklist: Patient identified, Timeout performed, Emergency Drugs available, Suction available and Patient being monitored Patient Re-evaluated:Patient Re-evaluated prior to induction Oxygen Delivery Method: Circle system utilized Preoxygenation: Pre-oxygenation with 100% oxygen Induction Type: IV induction Ventilation: Mask ventilation without difficulty LMA: LMA inserted LMA Size: 4.0 Number of attempts: 1 Placement Confirmation: positive ETCO2 and breath sounds checked- equal and bilateral Tube secured with: Tape Dental Injury: Teeth and Oropharynx as per pre-operative assessment

## 2017-05-26 NOTE — Anesthesia Preprocedure Evaluation (Signed)
Anesthesia Evaluation  Patient identified by MRN, date of birth, ID band Patient awake    Reviewed: Allergy & Precautions, NPO status , Patient's Chart, lab work & pertinent test results  Airway Mallampati: I  TM Distance: >3 FB     Dental  (+) Teeth Intact   Pulmonary Current Smoker,    breath sounds clear to auscultation       Cardiovascular negative cardio ROS   Rhythm:Regular Rate:Normal     Neuro/Psych negative neurological ROS  negative psych ROS   GI/Hepatic GERD  Controlled and Medicated,  Endo/Other  diabetes, Type 2  Renal/GU      Musculoskeletal  (+) Arthritis ,   Abdominal   Peds  Hematology   Anesthesia Other Findings   Reproductive/Obstetrics                             Anesthesia Physical Anesthesia Plan  ASA: II  Anesthesia Plan: General   Post-op Pain Management:    Induction: Intravenous  PONV Risk Score and Plan:   Airway Management Planned: LMA  Additional Equipment:   Intra-op Plan:   Post-operative Plan: Extubation in OR  Informed Consent: I have reviewed the patients History and Physical, chart, labs and discussed the procedure including the risks, benefits and alternatives for the proposed anesthesia with the patient or authorized representative who has indicated his/her understanding and acceptance.     Plan Discussed with:   Anesthesia Plan Comments:         Anesthesia Quick Evaluation

## 2017-05-26 NOTE — Interval H&P Note (Signed)
History and Physical Interval Note:  05/26/2017 11:08 AM  BP 110/60   Pulse 67   Temp 98.6 F (37 C) (Oral)   Resp 17   Ht 5\' 8"  (1.727 m)   Wt 214 lb (97.1 kg)   LMP 05/24/2017   SpO2 98%   BMI 32.54 kg/m   Emily Simmons  has presented today for surgery, with the diagnosis of right lateral meniscus tear  The various methods of treatment have been discussed with the patient and family. After consideration of risks, benefits and other options for treatment, the patient has consented to  Procedure(s): KNEE ARTHROSCOPY WITH LATERAL MENISECTOMY (Right) as a surgical intervention .  The patient's history has been reviewed, patient examined, no change in status, stable for surgery.  I have reviewed the patient's chart and labs.  Questions were answered to the patient's satisfaction.     Fuller CanadaStanley Dianely Krehbiel

## 2017-05-27 ENCOUNTER — Encounter (HOSPITAL_COMMUNITY): Payer: Self-pay | Admitting: Orthopedic Surgery

## 2017-05-30 NOTE — Addendum Note (Signed)
Addendum  created 05/30/17 1047 by Jeani HawkingBerry, Fredi Geiler J, CRNA   Anesthesia Attestations filed

## 2017-05-31 ENCOUNTER — Ambulatory Visit (INDEPENDENT_AMBULATORY_CARE_PROVIDER_SITE_OTHER): Payer: BLUE CROSS/BLUE SHIELD | Admitting: Orthopedic Surgery

## 2017-05-31 DIAGNOSIS — Z9889 Other specified postprocedural states: Secondary | ICD-10-CM

## 2017-05-31 DIAGNOSIS — Z4889 Encounter for other specified surgical aftercare: Secondary | ICD-10-CM

## 2017-05-31 NOTE — Progress Notes (Signed)
Postop visit #1 status post knee arthroscopy  Chief Complaint  Patient presents with  . Follow-up    Knee arthroscopy postop visit #1   Right knee arthroscopy partial lateral meniscectomy  The operative findings are    Medial normal medial meniscus, articular cartilage showed no gross abnormalities Lateral torn posterior horn lateral meniscus with flipped bucket-handle-like fragment into the notch Patellofemoral normal Notch anterior cruciate ligament intact and firm   Problems issues concerned: None at this time   No outpatient prescriptions have been marked as taking for the 05/31/17 encounter (Appointment) with Vickki HearingHarrison, Stanley E, MD.    Encounter Diagnoses  Name Primary?  Marland Kitchen. Aftercare following surgery Yes  . S/P right knee arthroscopy     The portal sites are clean dry and intact, the sutures were removed.  The calf was supple soft and the Homans sign was negative.  The patient is regaining knee flexion  Physical therapy will be started At home  Status post knee arthroscopy  Follow-up in  2-4 WEEKS

## 2017-06-08 ENCOUNTER — Telehealth: Payer: Self-pay | Admitting: Orthopedic Surgery

## 2017-06-08 NOTE — Telephone Encounter (Signed)
Patient of Dr Romeo Apple, requests refill; aware that Dr Hilda Lias is reviewing requests while Dr Romeo Apple out of office: oxyCODONE-acetaminophen (ROXICET) 5-325 MG tablet 30 tablet

## 2017-06-09 MED ORDER — OXYCODONE-ACETAMINOPHEN 5-325 MG PO TABS: 1.0000 | ORAL_TABLET | ORAL | 0 refills | Status: DC | PRN

## 2017-06-13 ENCOUNTER — Ambulatory Visit (INDEPENDENT_AMBULATORY_CARE_PROVIDER_SITE_OTHER): Payer: BLUE CROSS/BLUE SHIELD | Admitting: Orthopedic Surgery

## 2017-06-13 DIAGNOSIS — Z4889 Encounter for other specified surgical aftercare: Secondary | ICD-10-CM

## 2017-06-13 DIAGNOSIS — Z9889 Other specified postprocedural states: Secondary | ICD-10-CM

## 2017-06-13 MED ORDER — OXYCODONE-ACETAMINOPHEN 5-325 MG PO TABS: 1.0000 | ORAL_TABLET | ORAL | 0 refills | Status: DC | PRN

## 2017-06-13 NOTE — Progress Notes (Signed)
Postop visit #1 status post knee arthroscopy   Knee scope on 05/26/2017   The operative findings are    Medial normal medial meniscus, articular cartilage showed no gross abnormalities Lateral torn posterior horn lateral meniscus with flipped bucket-handle-like fragment into the notch Patellofemoral normal Notch anterior cruciate ligament intact and firm  HPI  PRE-OPERATIVE DIAGNOSIS:  right lateral meniscus tear   POST-OPERATIVE DIAGNOSIS:  right lateral meniscus tear   PROCEDURE:  Procedure(s): KNEE ARTHROSCOPY WITH LATERAL MENISECTOMY (Right)   Problems issues concerned: 20 knee flexion up to 100   No outpatient prescriptions have been marked as taking for the 06/13/17 encounter (Office Visit) with Vickki Hearing, MD.     The portal sites are clean dry and intact, the sutures were removed.  The calf was supple soft and the Homans sign was negative.  The patient is regaining knee flexion  Physical therapy will be started At home  Status post knee arthroscopy  Follow-up in  3-4 WEEKS   Meds ordered this encounter  Medications  . oxyCODONE-acetaminophen (ROXICET) 5-325 MG tablet    Sig: Take 1 tablet by mouth every 4 (four) hours as needed for severe pain.    Dispense:  42 tablet    Refill:  0

## 2017-06-23 ENCOUNTER — Encounter: Payer: Self-pay | Admitting: Orthopedic Surgery

## 2017-06-29 ENCOUNTER — Other Ambulatory Visit: Payer: Self-pay | Admitting: Orthopedic Surgery

## 2017-06-29 ENCOUNTER — Telehealth: Payer: Self-pay | Admitting: Orthopedic Surgery

## 2017-06-29 MED ORDER — HYDROCODONE-ACETAMINOPHEN 5-325 MG PO TABS: 1.0000 | ORAL_TABLET | Freq: Four times a day (QID) | ORAL | 0 refills | Status: DC | PRN

## 2017-06-29 NOTE — Telephone Encounter (Signed)
There have been two recent prescriptions written for this patient.  On 06-13-17, there was a prescription written for Oxycodone/Acetaminophen 5-325 mgs. And then a prescription written today, 06-29-17 for Hydrocodone/Acetaminophen 5-325 mgs. Patient picked up both of these prescriptions here today and took these to [facility] [place].  The pharmacist called and stated that at this time, he was only going to fill the prescription for the Hydrocodone 5-325 mgs.

## 2017-06-29 NOTE — Telephone Encounter (Signed)
Routing to dr harrison 

## 2017-06-29 NOTE — Telephone Encounter (Signed)
Patient requests refill on Oxycodone/Acetaminophen 5-325  Mgs.   Qty  42        Sig: Take 1 tablet by mouth every 4 (four) hours as needed for severe pain.

## 2017-06-30 NOTE — Telephone Encounter (Signed)
Ok hydrocodone is correct

## 2017-07-11 ENCOUNTER — Ambulatory Visit (INDEPENDENT_AMBULATORY_CARE_PROVIDER_SITE_OTHER): Payer: BLUE CROSS/BLUE SHIELD | Admitting: Orthopedic Surgery

## 2017-07-11 ENCOUNTER — Encounter: Payer: Self-pay | Admitting: Orthopedic Surgery

## 2017-07-11 VITALS — BP 122/87 | HR 99 | Ht 68.0 in | Wt 215.0 lb

## 2017-07-11 DIAGNOSIS — Z9889 Other specified postprocedural states: Secondary | ICD-10-CM

## 2017-07-11 DIAGNOSIS — Z4889 Encounter for other specified surgical aftercare: Secondary | ICD-10-CM

## 2017-07-11 NOTE — Addendum Note (Signed)
Addended by: Judd Gaudier on: 07/11/2017 10:23 AM   Modules accepted: Orders

## 2017-07-11 NOTE — Progress Notes (Signed)
Postop follow-up  Chief Complaint  Patient presents with  . Routine Post Op    Right knee DOS 05/26/2017    Postop day #46   PRE-OPERATIVE DIAGNOSIS:  right lateral meniscus tear   POST-OPERATIVE DIAGNOSIS:  right lateral meniscus tear   PROCEDURE:  Procedure(s): KNEE ARTHROSCOPY WITH LATERAL MENISECTOMY (Right)  The operative findings are    Medial normal medial meniscus, articular cartilage showed no gross abnormalities Lateral torn posterior horn lateral meniscus with flipped bucket-handle-like fragment into the notch Patellofemoral normal Notch anterior cruciate ligament intact and firm   Current Outpatient Prescriptions:  .  HYDROcodone-acetaminophen (NORCO/VICODIN) 5-325 MG tablet, Take 1 tablet by mouth every 6 (six) hours as needed for moderate pain., Disp: 30 tablet, Rfl: 0 .  ibuprofen (ADVIL,MOTRIN) 200 MG tablet, Take 600 mg by mouth every 6 (six) hours as needed for moderate pain., Disp: , Rfl:   Emily Simmons is still having quite a bit of swelling in her knee her range of motion is 15-90 degrees her pain is controlled well with ibuprofen she still using her cane  Recommend physical therapy and [place] in 3 weeks follow-up. We did allow her to wear hinged knee brace which she says stabilizes her knee  Encounter Diagnoses  Name Primary?  Marland Kitchen Aftercare following surgery Yes  . S/P right knee arthroscopy    Fu 4 weeks

## 2017-07-11 NOTE — Patient Instructions (Signed)
Continue ice and ibuprofen Wear hinged knee brace Start physical therapy  Come back in 4 weeks

## 2017-07-25 ENCOUNTER — Ambulatory Visit (HOSPITAL_COMMUNITY): Payer: BLUE CROSS/BLUE SHIELD | Attending: Orthopedic Surgery | Admitting: Physical Therapy

## 2017-07-25 ENCOUNTER — Encounter (HOSPITAL_COMMUNITY): Payer: Self-pay | Admitting: Physical Therapy

## 2017-07-25 ENCOUNTER — Telehealth (HOSPITAL_COMMUNITY): Payer: Self-pay

## 2017-07-25 DIAGNOSIS — M25561 Pain in right knee: Secondary | ICD-10-CM | POA: Diagnosis present

## 2017-07-25 DIAGNOSIS — M25661 Stiffness of right knee, not elsewhere classified: Secondary | ICD-10-CM | POA: Diagnosis not present

## 2017-07-25 DIAGNOSIS — R600 Localized edema: Secondary | ICD-10-CM | POA: Diagnosis present

## 2017-07-25 DIAGNOSIS — R262 Difficulty in walking, not elsewhere classified: Secondary | ICD-10-CM | POA: Diagnosis present

## 2017-07-25 NOTE — Telephone Encounter (Signed)
Patient son is having surgery on 07-28-17

## 2017-07-25 NOTE — Therapy (Signed)
[facility] [address] [place], [place], [address] Phone: [phone]   Fax:  [phone]  Physical Therapy Evaluation  Patient Details  Name: Emily Simmons MRN: [id number] Date of Birth: [birth year] Referring Provider: Fuller Canada   Encounter Date: 07/25/2017      PT End of Session - 07/25/17 1054    Visit Number 1   Number of Visits 13   Date for PT Re-Evaluation 08/22/17   Authorization Type [employer] Other (30 visit limit)   Authorization Time Period 07/25/17 to 08/25/17   Authorization - Visit Number 1   Authorization - Number of Visits 30   PT Start Time 0950   PT Stop Time 1025   PT Time Calculation (min) 35 min   Activity Tolerance Patient tolerated treatment well   Behavior During Therapy [facility] for tasks assessed/performed      Past Medical History:  Diagnosis Date  . Arthritis    Right knee  . Chronic back pain 2008  . Diabetes mellitus without complication (HCC)   . GERD (gastroesophageal reflux disease)     Past Surgical History:  Procedure Laterality Date  . CARPAL TUNNEL RELEASE Right   . KNEE ARTHROSCOPY WITH LATERAL MENISECTOMY Right 05/26/2017   Procedure: KNEE ARTHROSCOPY WITH LATERAL MENISECTOMY;  Surgeon: Vickki Hearing, MD;  Location: [facility];  Service: Orthopedics;  Laterality: Right;  . KNEE SURGERY Right   . TUBAL LIGATION      There were no vitals filed for this visit.       Subjective Assessment - 07/25/17 0952    Subjective Patient arrives after having knee scope on August 9th with Dr. Romeo Apple; she reports a history of another scope done on this knee in highschool. She has an old tear in her meniscus that led to this second surgery. It is hard for her to stand for a long period of time, as well as moving the knee in general. No falls or close calls recently.    Pertinent History chronic LBP, borderline DM, hx old R knee scope    How long can you sit comfortably? 60 minutes    How long can  you stand comfortably? 10-15 minutes   How long can you walk comfortably? 10-15 minutes    Patient Stated Goals get back to normal, get back to PLOF, do a lot of walking    Currently in Pain? Yes  at worst 10/10   Pain Score 6    Pain Location Knee   Pain Orientation Right   Pain Descriptors / Indicators Dull;Throbbing;Aching   Pain Type Chronic pain   Pain Radiating Towards none    Pain Onset More than a month ago   Pain Frequency Constant   Aggravating Factors  being on it too much, being in one position too long, standing    Pain Relieving Factors elevation    Effect of Pain on Daily Activities severe            [facility] PT Assessment - 07/25/17 0001      Assessment   Medical Diagnosis s/p R knee scope    Referring Provider Fuller Canada    Onset Date/Surgical Date 05/26/17   Next MD Visit Dr. Romeo Apple October 22nd    Prior Therapy none      Precautions   Precautions None     Restrictions   Weight Bearing Restrictions No     Balance Screen   Has the patient fallen in the past 6  months No   Has the patient had a decrease in activity level because of a fear of falling?  Yes   Is the patient reluctant to leave their home because of a fear of falling?  No     Prior Function   Level of Independence Independent;Independent with basic ADLs;Independent with gait;Independent with transfers   Vocation Unemployed     Observation/Other Assessments   Observations incisions appear well healed      AROM   Right Knee Extension 25   Right Knee Flexion 97     Strength   Right Hip Flexion 4+/5   Right Hip Extension 4+/5   Right Hip ABduction 5/5   Left Hip Flexion 5/5   Left Hip Extension 4+/5   Left Hip ABduction 5/5   Right Knee Flexion 4/5   Right Knee Extension 4+/5   Left Knee Flexion 5/5   Left Knee Extension 5/5   Right Ankle Dorsiflexion 5/5   Left Ankle Dorsiflexion 5/5     Ambulation/Gait   Gait Comments antalgic pattern, proximal weakness, reduced ROM R  knee, impaired heel toe pattern, flexed at hips; stiars; step to pattern, antalgic, difficulty with recipriocal pattern and stair descent      High Level Balance   High Level Balance Comments TUG 10 no device             Objective measurements completed on examination: See above findings.                  PT Education - 07/25/17 1053    Education provided Yes   Education Details exam findings, prognosis, POC, HEP    Person(s) Educated Patient   Methods Explanation;Demonstration;Handout   Comprehension Verbalized understanding;Returned demonstration;Need further instruction          PT Short Term Goals - 07/25/17 1057      PT SHORT TERM GOAL #1   Title Patient to demonstrate R knee extension ROM as being 10 degrees in order to improve gait mechanics and reduce compensation pattern    Time 2   Period Weeks   Status New   Target Date 08/08/17     PT SHORT TERM GOAL #2   Title Patient to demonstrate R knee flexion ROM as being 110 degrees in order to improve gait mechanics and reduce compensation pattern    Time 2   Period Weeks   Status New     PT SHORT TERM GOAL #3   Title Patient to demonstrate consistent heel toe gait pattern, improved TKE, equal stance/swing phases, and elimiination of antalgic pattern in order to improve efficiency and quality of gait    Time 2   Period Weeks   Status New     PT SHORT TERM GOAL #4   Title Patient to be compliant with correct performance of HEP, to be updated PRN    Time 1   Period Weeks   Status New   Target Date 08/01/17           PT Long Term Goals - 07/25/17 1100      PT LONG TERM GOAL #1   Title Patient to demonstrate R knee ROM as being 0-120 degrees in order to improve gait pattern and efficacy of movement    Time 4   Period Weeks   Status New   Target Date 08/22/17     PT LONG TERM GOAL #2   Title Patient to be able to reciprocally ascend/descend full flight of stairs  with no railing, good  eccentric control, in order to show improved home/community access    Time 4   Period Weeks   Status New     PT LONG TERM GOAL #3   Title Patient to be able to tolerate closed chain activity for at least 90 minutes with R knee pain no more than 2/10 in order to assist in return to PLOF    Time 4   Period Weeks   Status New                Plan - 07/25/17 1055    Clinical Impression Statement Patient arrives today after having had R knee scope in August 2018; she reports her biggest difficulties right now are stairs and being able to do activity for an extended amount of time to play with her children. Examination is typical for her post-op state, including gait deviation, difficulty with stairs, localized edema, R knee stiffness, and mild functional weakness. Recommend skilled PT services to address functional deficits and assist in return to optimal level of function.    History and Personal Factors relevant to plan of care: history of prior R knee scope   Clinical Presentation Stable   Clinical Presentation due to: post-op state    Clinical Decision Making Low   Rehab Potential Excellent   Clinical Impairments Affecting Rehab Potential (+) motivated, good outcomes with PT in the past, no major significant PMH   PT Frequency 3x / week   PT Duration 4 weeks   PT Treatment/Interventions ADLs/Self Care Home Management;Biofeedback;Cryotherapy;DME Instruction;Gait training;Stair training;Functional mobility training;Therapeutic activities;Therapeutic exercise;Balance training;Neuromuscular re-education;Patient/family education;Manual techniques;Scar mobilization;Passive range of motion;Dry needling;Energy conservation;Taping   PT Next Visit Plan review initial eval/goals, HEP; focus on edema control and ROM, gait training as MMT is generally 4+/5-5/5    PT Home Exercise Plan Eval: quad sets, weighted knee extension stretch, heel slides, knee flexion stretch, hamstring stretch     Consulted and Agree with Plan of Care Patient      Patient will benefit from skilled therapeutic intervention in order to improve the following deficits and impairments:  Abnormal gait, Increased fascial restricitons, Pain, Improper body mechanics, Decreased coordination, Decreased mobility, Increased muscle spasms, Postural dysfunction, Decreased scar mobility, Decreased activity tolerance, Decreased range of motion, Decreased strength, Difficulty walking, Increased edema, Impaired flexibility  Visit Diagnosis: Stiffness of right knee, not elsewhere classified - Plan: PT plan of care cert/re-cert  Acute pain of right knee - Plan: PT plan of care cert/re-cert  Difficulty in walking, not elsewhere classified - Plan: PT plan of care cert/re-cert  Localized edema - Plan: PT plan of care cert/re-cert     Problem List Patient Active Problem List   Diagnosis Date Noted  . Lateral meniscus, posterior horn derangement, right     Nedra Hai PT, DPT [phone]  [facility] [address] [place], [place], [address] Phone: [phone]   Fax:  [phone]  Name: Emily Simmons MRN: [id number] Date of Birth: [birth year]

## 2017-07-25 NOTE — Patient Instructions (Signed)
   QUAD SET  Tighten your top thigh muscle as you attempt to press the back of your knee downward towards the table.  Hold for 3-5 seconds.  Repeat 15 times, at least 3 times per day.    HEEL SLIDES - SUPINE  Lying on your back with knees straight, slide the affected heel towards your buttock as you bend your knee. You may use a sheet to help slide your leg.   Hold a gentle stretch in this position and then return to original position.  Hold for 3-5 seconds.  Repeat 15 times, at least 3 times per day.     KNEE EXTENSION STRETCH - PROPPED AND WEIGHTED  While seated, prop your foot up on another chair and allow gravity to stretch your knee towards a more straightened position. Place a weight such as a back pack, ankle weight or other item on the knee for an increased stretch.   You may also do this exercise laying on your back with your leg out straight and an ankle weight placed on your knee like we did in clinic as well.  Hold this position for as long as you can tolerate, and increase your time as able. Repeat 2 times per day.      KNEE FLEXION STRETCH - SELF ASSISTED  While seated in a chair, use your unaffected leg to bend your affected knee until a stretch is felt.  Hold for 3-5 seconds.  Repeat 10 times, at least 3 times per day.     SEATED HAMSTRING STRETCH  While seated, rest your heel on the floor with your knee straight and gently lean forward until a stretch is felt behind your knee/thigh.  Start by holding this for 20 seconds. As this gets more tolerable, you can increase the time you are spending on this stretch.  Repeat 3 times with your right leg, 3 times per day.

## 2017-07-27 ENCOUNTER — Ambulatory Visit (HOSPITAL_COMMUNITY): Payer: BLUE CROSS/BLUE SHIELD | Admitting: Physical Therapy

## 2017-07-27 ENCOUNTER — Telehealth: Payer: Self-pay | Admitting: Orthopedic Surgery

## 2017-07-27 ENCOUNTER — Telehealth (HOSPITAL_COMMUNITY): Payer: Self-pay | Admitting: Physical Therapy

## 2017-07-27 DIAGNOSIS — M25661 Stiffness of right knee, not elsewhere classified: Secondary | ICD-10-CM | POA: Diagnosis not present

## 2017-07-27 DIAGNOSIS — M25561 Pain in right knee: Secondary | ICD-10-CM

## 2017-07-27 DIAGNOSIS — R262 Difficulty in walking, not elsewhere classified: Secondary | ICD-10-CM

## 2017-07-27 DIAGNOSIS — R600 Localized edema: Secondary | ICD-10-CM

## 2017-07-27 NOTE — Therapy (Signed)
[facility] [address] [place], [place], [address] Phone: [phone]   Fax:  [phone]  Physical Therapy Treatment  Patient Details  Name: Emily Simmons MRN: [id number] Date of Birth: 11-Nov-1975 Referring Provider: Fuller Canada   Encounter Date: 07/27/2017      PT End of Session - 07/27/17 1028    Visit Number 2   Number of Visits 13   Date for PT Re-Evaluation 08/22/17   Authorization Type [employer] Other (30 visit limit)   Authorization Time Period 07/25/17 to 08/25/17   Authorization - Visit Number 2   Authorization - Number of Visits 30   PT Start Time 0935   PT Stop Time 1015   PT Time Calculation (min) 40 min   Activity Tolerance Patient tolerated treatment well   Behavior During Therapy [facility] for tasks assessed/performed      Past Medical History:  Diagnosis Date  . Arthritis    Right knee  . Chronic back pain 2008  . Diabetes mellitus without complication (HCC)   . GERD (gastroesophageal reflux disease)     Past Surgical History:  Procedure Laterality Date  . CARPAL TUNNEL RELEASE Right   . KNEE ARTHROSCOPY WITH LATERAL MENISECTOMY Right 05/26/2017   Procedure: KNEE ARTHROSCOPY WITH LATERAL MENISECTOMY;  Surgeon: Vickki Hearing, MD;  Location: [facility];  Service: Orthopedics;  Laterality: Right;  . KNEE SURGERY Right   . TUBAL LIGATION      There were no vitals filed for this visit.      Subjective Assessment - 07/27/17 0942    Subjective Pt states she is sore and stiff but no real pain.  Noted restriction in ROM when arrived.  Pt does not use AD to walk with.     Currently in Pain? No/denies                         [facility] Adult PT Treatment/Exercise - 07/27/17 0001      Knee/Hip Exercises: Stretches   Active Hamstring Stretch Right;10 seconds   Active Hamstring Stretch Limitations 12" box alternating with knee flexion, 10 reps   Knee: Self-Stretch to increase Flexion Right;10 seconds    Knee: Self-Stretch Limitations 12" box alteranating with HS stretch, 10 reps   Gastroc Stretch Both;3 reps;30 seconds   Gastroc Stretch Limitations both with slant board     Knee/Hip Exercises: Aerobic   Recumbent Bike 5 minutes full revolutions at seat 12     Knee/Hip Exercises: Seated   Long Arc Quad Right;15 reps     Knee/Hip Exercises: Supine   Quad Sets Right;10 reps   Short Arc Quad Sets Right;15 reps   Heel Slides Right;10 reps   Knee Extension Limitations 10   Knee Flexion Limitations 100     Knee/Hip Exercises: Prone   Prone Knee Hang 5 minutes   Prone Knee Hang Limitations with manual     Manual Therapy   Manual Therapy Soft tissue mobilization;Myofascial release   Manual therapy comments completed seperately from all other skilled interventions   Soft tissue mobilization to mobilize tissue and promote increased ROM   Myofascial Release to decrease adhesions (mainly medial knee scar tissue)                PT Education - 07/27/17 1029    Education provided Yes   Education Details reviewed intial evaluation findings and goals.  Reviewed HEP   Person(s) Educated Patient   Methods Explanation;Verbal  cues;Handout;Tactile cues;Demonstration   Comprehension Verbalized understanding;Returned demonstration;Verbal cues required;Tactile cues required          PT Short Term Goals - 07/25/17 1057      PT SHORT TERM GOAL #1   Title Patient to demonstrate R knee extension ROM as being 10 degrees in order to improve gait mechanics and reduce compensation pattern    Time 2   Period Weeks   Status New   Target Date 08/08/17     PT SHORT TERM GOAL #2   Title Patient to demonstrate R knee flexion ROM as being 110 degrees in order to improve gait mechanics and reduce compensation pattern    Time 2   Period Weeks   Status New     PT SHORT TERM GOAL #3   Title Patient to demonstrate consistent heel toe gait pattern, improved TKE, equal stance/swing phases, and  elimiination of antalgic pattern in order to improve efficiency and quality of gait    Time 2   Period Weeks   Status New     PT SHORT TERM GOAL #4   Title Patient to be compliant with correct performance of HEP, to be updated PRN    Time 1   Period Weeks   Status New   Target Date 08/01/17           PT Long Term Goals - 07/25/17 1100      PT LONG TERM GOAL #1   Title Patient to demonstrate R knee ROM as being 0-120 degrees in order to improve gait pattern and efficacy of movement    Time 4   Period Weeks   Status New   Target Date 08/22/17     PT LONG TERM GOAL #2   Title Patient to be able to reciprocally ascend/descend full flight of stairs with no railing, good eccentric control, in order to show improved home/community access    Time 4   Period Weeks   Status New     PT LONG TERM GOAL #3   Title Patient to be able to tolerate closed chain activity for at least 90 minutes with R knee pain no more than 2/10 in order to assist in return to PLOF    Time 4   Period Weeks   Status New               Plan - 07/27/17 1149    Clinical Impression Statement Continued with focus on ROM as noted limitations in both flexion and extension.  Added manual with focus on medial aspect of knee with noted improvement of ROM at EOS of10-100 degrees.  Added prone knee hang as well and completed retro massage for swelling.  Encouraged to complete quad sets as has minimal quad contraction.  Better contraction achieved with LAQ.  Marland Kitchen   Rehab Potential Excellent   Clinical Impairments Affecting Rehab Potential (+) motivated, good outcomes with PT in the past, no major significant PMH   PT Frequency 3x / week   PT Duration 4 weeks   PT Treatment/Interventions ADLs/Self Care Home Management;Biofeedback;Cryotherapy;DME Instruction;Gait training;Stair training;Functional mobility training;Therapeutic activities;Therapeutic exercise;Balance training;Neuromuscular re-education;Patient/family  education;Manual techniques;Scar mobilization;Passive range of motion;Dry needling;Energy conservation;Taping   PT Next Visit Plan continue with focus on edema control and ROM.  Quad activation with quad sets and decreasing restrictions,   PT Home Exercise Plan Eval: quad sets, weighted knee extension stretch, heel slides, knee flexion stretch, hamstring stretch    Consulted and Agree with Plan of Care Patient  Patient will benefit from skilled therapeutic intervention in order to improve the following deficits and impairments:  Abnormal gait, Increased fascial restricitons, Pain, Improper body mechanics, Decreased coordination, Decreased mobility, Increased muscle spasms, Postural dysfunction, Decreased scar mobility, Decreased activity tolerance, Decreased range of motion, Decreased strength, Difficulty walking, Increased edema, Impaired flexibility  Visit Diagnosis: Stiffness of right knee, not elsewhere classified  Acute pain of right knee  Difficulty in walking, not elsewhere classified  Localized edema     Problem List Patient Active Problem List   Diagnosis Date Noted  . Lateral meniscus, posterior horn derangement, right    Lurena Nida, PTA/CLT [phone]  Lurena Nida 07/27/2017, 11:58 AM  [facility] [address] [place], [place], [address] Phone: [phone]   Fax:  [phone]  Name: SKYLIN KENNERSON MRN: [id number] Date of Birth: Jul 03, 1976

## 2017-07-27 NOTE — Telephone Encounter (Addendum)
Pt did not show for appt.  Assume due to son having surgery tomorrow. Pt then showed 23 minutes late for appt and was able to work into schedule. Lurena Nida, PTA/CLT [phone]

## 2017-07-27 NOTE — Telephone Encounter (Signed)
Patient requested a refill on Hydrocodone/Acetaminophen 5-325 mgs.   Qty  30        Sig: Take 1 tablet by mouth every 6 (six) hours as needed for moderate pain.

## 2017-07-27 NOTE — Telephone Encounter (Signed)
Please use ibuprofen and tylenol  Tylenol 500 mg every 6 hours and ibuprofen 800 every 8 hours

## 2017-07-28 ENCOUNTER — Encounter (HOSPITAL_COMMUNITY): Payer: Self-pay

## 2017-08-01 ENCOUNTER — Other Ambulatory Visit: Payer: Self-pay | Admitting: Radiology

## 2017-08-01 ENCOUNTER — Ambulatory Visit (HOSPITAL_COMMUNITY): Payer: BLUE CROSS/BLUE SHIELD | Admitting: Physical Therapy

## 2017-08-01 ENCOUNTER — Other Ambulatory Visit: Payer: Self-pay | Admitting: Orthopedic Surgery

## 2017-08-01 ENCOUNTER — Telehealth: Payer: Self-pay | Admitting: Orthopedic Surgery

## 2017-08-01 ENCOUNTER — Encounter (HOSPITAL_COMMUNITY): Payer: Self-pay | Admitting: Physical Therapy

## 2017-08-01 DIAGNOSIS — M25661 Stiffness of right knee, not elsewhere classified: Secondary | ICD-10-CM | POA: Diagnosis not present

## 2017-08-01 DIAGNOSIS — R262 Difficulty in walking, not elsewhere classified: Secondary | ICD-10-CM

## 2017-08-01 DIAGNOSIS — R600 Localized edema: Secondary | ICD-10-CM

## 2017-08-01 DIAGNOSIS — M25561 Pain in right knee: Secondary | ICD-10-CM

## 2017-08-01 MED ORDER — TRAMADOL-ACETAMINOPHEN 37.5-325 MG PO TABS: 1.0000 | ORAL_TABLET | ORAL | 5 refills | Status: DC | PRN

## 2017-08-01 NOTE — Telephone Encounter (Signed)
Ok, can pick up [employer]

## 2017-08-01 NOTE — Telephone Encounter (Signed)
Faxed Ultracet to [facility]

## 2017-08-01 NOTE — Therapy (Signed)
[place] [facility] [address] [place], [place], [address] Phone: [phone]   Fax:  [phone]  Physical Therapy Treatment  Patient Details  Name: Emily Simmons MRN: [id number] Date of Birth: 03/04/1976 Referring Provider: Fuller Canada   Encounter Date: 08/01/2017      PT End of Session - 08/01/17 1027    Visit Number 3   Number of Visits 13   Date for PT Re-Evaluation 08/22/17   Authorization Type [employer] Other (30 visit limit)   Authorization Time Period 07/25/17 to 08/25/17   Authorization - Visit Number 3   Authorization - Number of Visits 30   PT Start Time 0946   PT Stop Time 1026   PT Time Calculation (min) 40 min   Activity Tolerance Patient tolerated treatment well   Behavior During Therapy [facility] for tasks assessed/performed      Past Medical History:  Diagnosis Date  . Arthritis    Right knee  . Chronic back pain 2008  . Diabetes mellitus without complication (HCC)   . GERD (gastroesophageal reflux disease)     Past Surgical History:  Procedure Laterality Date  . CARPAL TUNNEL RELEASE Right   . KNEE ARTHROSCOPY WITH LATERAL MENISECTOMY Right 05/26/2017   Procedure: KNEE ARTHROSCOPY WITH LATERAL MENISECTOMY;  Surgeon: Vickki Hearing, MD;  Location: [facility];  Service: Orthopedics;  Laterality: Right;  . KNEE SURGERY Right   . TUBAL LIGATION      There were no vitals filed for this visit.      Subjective Assessment - 08/01/17 0947    Subjective Patient arrives stating she is not feeling good today, her knee feels like it is stuck in a bent position. It is just not feeling good today.    Pertinent History chronic LBP, borderline DM, hx old R knee scope    Patient Stated Goals get back to normal, get back to PLOF, do a lot of walking    Currently in Pain? Yes   Pain Score 8    Pain Location Knee   Pain Orientation Right   Pain Descriptors / Indicators Aching;Throbbing   Pain Type Chronic pain   Pain Radiating  Towards none    Pain Onset More than a month ago   Pain Frequency Constant   Aggravating Factors  everything    Pain Relieving Factors staying off of it, elevation    Effect of Pain on Daily Activities severe                          [facility] Adult PT Treatment/Exercise - 08/01/17 0001      Knee/Hip Exercises: Stretches   Active Hamstring Stretch Right;4 reps;30 seconds   Active Hamstring Stretch Limitations supine    Gastroc Stretch Both;3 reps;30 seconds   Gastroc Stretch Limitations both with slant board     Knee/Hip Exercises: Standing   Heel Raises Both;1 set;20 reps   Heel Raises Limitations toe and heel      Knee/Hip Exercises: Supine   Quad Sets Right;15 reps   Quad Sets Limitations 5 second holds    Heel Slides Right;15 reps   Heel Slides Limitations 5 second holds      Manual Therapy   Manual Therapy Edema management;Soft tissue mobilization   Manual therapy comments completed seperately from all other skilled interventions   Edema Management retrograde with LE elevated    Soft tissue mobilization R HS in prone  PT Education - 08/01/17 1027    Education provided Yes   Education Details increase focus on ice and ROM/stretching exercsies the rest of today    Person(s) Educated Patient   Methods Explanation   Comprehension Verbalized understanding          PT Short Term Goals - 07/25/17 1057      PT SHORT TERM GOAL #1   Title Patient to demonstrate R knee extension ROM as being 10 degrees in order to improve gait mechanics and reduce compensation pattern    Time 2   Period Weeks   Status New   Target Date 08/08/17     PT SHORT TERM GOAL #2   Title Patient to demonstrate R knee flexion ROM as being 110 degrees in order to improve gait mechanics and reduce compensation pattern    Time 2   Period Weeks   Status New     PT SHORT TERM GOAL #3   Title Patient to demonstrate consistent heel toe gait pattern, improved  TKE, equal stance/swing phases, and elimiination of antalgic pattern in order to improve efficiency and quality of gait    Time 2   Period Weeks   Status New     PT SHORT TERM GOAL #4   Title Patient to be compliant with correct performance of HEP, to be updated PRN    Time 1   Period Weeks   Status New   Target Date 08/01/17           PT Long Term Goals - 07/25/17 1100      PT LONG TERM GOAL #1   Title Patient to demonstrate R knee ROM as being 0-120 degrees in order to improve gait pattern and efficacy of movement    Time 4   Period Weeks   Status New   Target Date 08/22/17     PT LONG TERM GOAL #2   Title Patient to be able to reciprocally ascend/descend full flight of stairs with no railing, good eccentric control, in order to show improved home/community access    Time 4   Period Weeks   Status New     PT LONG TERM GOAL #3   Title Patient to be able to tolerate closed chain activity for at least 90 minutes with R knee pain no more than 2/10 in order to assist in return to PLOF    Time 4   Period Weeks   Status New               Plan - 08/01/17 1028    Clinical Impression Statement Patient arrives today stating her knee is not feeling good at all, it has been very painful and feels as if it were "stuck" in a flexed position. Spent quite a bit of time on manual techniques today to reduce edema and pain, also to increase ROM. Otherwise worked on functional stretching, ROM based activities, and pre-gait tasks this session with improvement in ROM and symptoms noted at end of session.    Rehab Potential Excellent   Clinical Impairments Affecting Rehab Potential (+) motivated, good outcomes with PT in the past, no major significant PMH   PT Frequency 3x / week   PT Duration 4 weeks   PT Treatment/Interventions ADLs/Self Care Home Management;Biofeedback;Cryotherapy;DME Instruction;Gait training;Stair training;Functional mobility training;Therapeutic  activities;Therapeutic exercise;Balance training;Neuromuscular re-education;Patient/family education;Manual techniques;Scar mobilization;Passive range of motion;Dry needling;Energy conservation;Taping   PT Next Visit Plan continue with focus on edema control and ROM.  Quad activation  with quad sets and decreasing restrictions,   PT Home Exercise Plan Eval: quad sets, weighted knee extension stretch, heel slides, knee flexion stretch, hamstring stretch    Consulted and Agree with Plan of Care Patient      Patient will benefit from skilled therapeutic intervention in order to improve the following deficits and impairments:  Abnormal gait, Increased fascial restricitons, Pain, Improper body mechanics, Decreased coordination, Decreased mobility, Increased muscle spasms, Postural dysfunction, Decreased scar mobility, Decreased activity tolerance, Decreased range of motion, Decreased strength, Difficulty walking, Increased edema, Impaired flexibility  Visit Diagnosis: Stiffness of right knee, not elsewhere classified  Acute pain of right knee  Difficulty in walking, not elsewhere classified  Localized edema     Problem List Patient Active Problem List   Diagnosis Date Noted  . Lateral meniscus, posterior horn derangement, right     Nedra Hai PT, DPT [phone]  [facility] [address] [place], [place], [address] Phone: [phone]   Fax:  [phone]  Name: ORLANDRIA KISSNER MRN: [id number] Date of Birth: [birth year]

## 2017-08-01 NOTE — Telephone Encounter (Signed)
Patient is asking if you will do a prescription for pain medication. She stated that the Tylenol and Ibuprofen is not helping at all.  Please advise

## 2017-08-03 ENCOUNTER — Ambulatory Visit (HOSPITAL_COMMUNITY): Payer: BLUE CROSS/BLUE SHIELD | Admitting: Physical Therapy

## 2017-08-03 DIAGNOSIS — M25561 Pain in right knee: Secondary | ICD-10-CM

## 2017-08-03 DIAGNOSIS — R262 Difficulty in walking, not elsewhere classified: Secondary | ICD-10-CM

## 2017-08-03 DIAGNOSIS — R600 Localized edema: Secondary | ICD-10-CM

## 2017-08-03 DIAGNOSIS — M25661 Stiffness of right knee, not elsewhere classified: Secondary | ICD-10-CM | POA: Diagnosis not present

## 2017-08-03 NOTE — Therapy (Signed)
[place] [facility] [address] [place], [place], [address] Phone: [phone]   Fax:  [phone]  Physical Therapy Treatment  Patient Details  Name: Emily Simmons MRN: [id number] Date of Birth: [birth year] Referring Provider: Fuller Canada   Encounter Date: 08/03/2017      PT End of Session - 08/03/17 0945    Visit Number 4   Number of Visits 13   Date for PT Re-Evaluation 08/22/17   Authorization Type BC[employer] Other (30 visit limit)   Authorization Time Period 07/25/17 to 08/25/17   Authorization - Visit Number 4   Authorization - Number of Visits 30   PT Start Time 0902   PT Stop Time 0950  5 minutes on bike without charge   PT Time Calculation (min) 48 min   Activity Tolerance Patient tolerated treatment well   Behavior During Therapy [facility] for tasks assessed/performed      Past Medical History:  Diagnosis Date  . Arthritis    Right knee  . Chronic back pain 2008  . Diabetes mellitus without complication (HCC)   . GERD (gastroesophageal reflux disease)     Past Surgical History:  Procedure Laterality Date  . CARPAL TUNNEL RELEASE Right   . KNEE ARTHROSCOPY WITH LATERAL MENISECTOMY Right 05/26/2017   Procedure: KNEE ARTHROSCOPY WITH LATERAL MENISECTOMY;  Surgeon: Vickki Hearing, MD;  Location: [facility];  Service: Orthopedics;  Laterality: Right;  . KNEE SURGERY Right   . TUBAL LIGATION      There were no vitals filed for this visit.      Subjective Assessment - 08/03/17 0908    Subjective Pt reports no pain, just stiffness in her Rt knee.  Reports compliance with HEP   Currently in Pain? No/denies                         [facility] Adult PT Treatment/Exercise - 08/03/17 0001      Knee/Hip Exercises: Stretches   Active Hamstring Stretch Right;30 seconds;3 reps   Active Hamstring Stretch Limitations 12" box    Knee: Self-Stretch to increase Flexion Right;10 seconds   Knee: Self-Stretch Limitations 12" box ,  10 reps   Gastroc Stretch Both;3 reps;30 seconds   Gastroc Stretch Limitations both with slant board     Knee/Hip Exercises: Aerobic   Recumbent Bike 5 minutes full revolutions at seat 10     Knee/Hip Exercises: Standing   Heel Raises Both;1 set;20 reps   Heel Raises Limitations toe and heel      Knee/Hip Exercises: Seated   Long Arc Quad Right;15 reps     Knee/Hip Exercises: Supine   Quad Sets Right;15 reps   Quad Sets Limitations 5" holds   Heel Slides Right;15 reps   Heel Slides Limitations 5 second holds    Knee Extension Limitations 10   Knee Flexion Limitations 100     Manual Therapy   Manual Therapy Edema management;Soft tissue mobilization   Manual therapy comments completed seperately from all other skilled interventions   Edema Management retrograde with LE elevated    Soft tissue mobilization to mobilize tissue/adhesions to promote increased mobility                  PT Short Term Goals - 07/25/17 1057      PT SHORT TERM GOAL #1   Title Patient to demonstrate R knee extension ROM as being 10 degrees in order to improve gait mechanics and reduce  compensation pattern    Time 2   Period Weeks   Status New   Target Date 08/08/17     PT SHORT TERM GOAL #2   Title Patient to demonstrate R knee flexion ROM as being 110 degrees in order to improve gait mechanics and reduce compensation pattern    Time 2   Period Weeks   Status New     PT SHORT TERM GOAL #3   Title Patient to demonstrate consistent heel toe gait pattern, improved TKE, equal stance/swing phases, and elimiination of antalgic pattern in order to improve efficiency and quality of gait    Time 2   Period Weeks   Status New     PT SHORT TERM GOAL #4   Title Patient to be compliant with correct performance of HEP, to be updated PRN    Time 1   Period Weeks   Status New   Target Date 08/01/17           PT Long Term Goals - 07/25/17 1100      PT LONG TERM GOAL #1   Title Patient to  demonstrate R knee ROM as being 0-120 degrees in order to improve gait pattern and efficacy of movement    Time 4   Period Weeks   Status New   Target Date 08/22/17     PT LONG TERM GOAL #2   Title Patient to be able to reciprocally ascend/descend full flight of stairs with no railing, good eccentric control, in order to show improved home/community access    Time 4   Period Weeks   Status New     PT LONG TERM GOAL #3   Title Patient to be able to tolerate closed chain activity for at least 90 minutes with R knee pain no more than 2/10 in order to assist in return to PLOF    Time 4   Period Weeks   Status New               Plan - 08/03/17 0945    Clinical Impression Statement Pt reports overall improvement as compared to last session with less stiffness, however still feels like"not my knee" and c/o her knee cap feeling like it is not in the right place and swelling at both incision sites.  This is also where her pain is located.  Manual focused on these areas to reduce pain, adhesions and edema.  Still only with 10-100 degrees AROM and with painful endfeel with attempts at PROM.  Finished session with bike with abiltiy to move seat up 2 spaces to encourage flexion.    Rehab Potential Excellent   Clinical Impairments Affecting Rehab Potential (+) motivated, good outcomes with PT in the past, no major significant PMH   PT Frequency 3x / week   PT Duration 4 weeks   PT Treatment/Interventions ADLs/Self Care Home Management;Biofeedback;Cryotherapy;DME Instruction;Gait training;Stair training;Functional mobility training;Therapeutic activities;Therapeutic exercise;Balance training;Neuromuscular re-education;Patient/family education;Manual techniques;Scar mobilization;Passive range of motion;Dry needling;Energy conservation;Taping   PT Next Visit Plan continue with focus on edema control and ROM.  Quad activation with quad sets and decreasing restrictions. May try biodex PROM next  session.   PT Home Exercise Plan Eval: quad sets, weighted knee extension stretch, heel slides, knee flexion stretch, hamstring stretch    Consulted and Agree with Plan of Care Patient      Patient will benefit from skilled therapeutic intervention in order to improve the following deficits and impairments:  Abnormal gait, Increased fascial restricitons,  Pain, Improper body mechanics, Decreased coordination, Decreased mobility, Increased muscle spasms, Postural dysfunction, Decreased scar mobility, Decreased activity tolerance, Decreased range of motion, Decreased strength, Difficulty walking, Increased edema, Impaired flexibility  Visit Diagnosis: Stiffness of right knee, not elsewhere classified  Acute pain of right knee  Difficulty in walking, not elsewhere classified  Localized edema     Problem List Patient Active Problem List   Diagnosis Date Noted  . Lateral meniscus, posterior horn derangement, right    Emily Simmons, PTA/CLT [phone]  Emily Simmons, Emily Simmons 08/03/2017, 9:48 AM  [facility] [address] , [place], [address] Phone: [phone]   Fax:  [phone]  Name: Emily Simmons MRN: [id number] Date of Birth: 11-24-1975

## 2017-08-05 ENCOUNTER — Ambulatory Visit (HOSPITAL_COMMUNITY): Payer: BLUE CROSS/BLUE SHIELD

## 2017-08-05 ENCOUNTER — Encounter (HOSPITAL_COMMUNITY): Payer: Self-pay

## 2017-08-05 DIAGNOSIS — R600 Localized edema: Secondary | ICD-10-CM

## 2017-08-05 DIAGNOSIS — M25661 Stiffness of right knee, not elsewhere classified: Secondary | ICD-10-CM | POA: Diagnosis not present

## 2017-08-05 DIAGNOSIS — R262 Difficulty in walking, not elsewhere classified: Secondary | ICD-10-CM

## 2017-08-05 DIAGNOSIS — M25561 Pain in right knee: Secondary | ICD-10-CM

## 2017-08-05 NOTE — Therapy (Signed)
[place] [facility] [address] [place], [place], [address] Phone: [phone]   Fax:  [phone]  Physical Therapy Treatment  Patient Details  Name: Emily Simmons MRN: [id number] Date of Birth: 1976-01-30 Referring Provider: Fuller CanadaStanley Harrison   Encounter Date: 08/05/2017      PT End of Session - 08/05/17 0858    Visit Number 5   Number of Visits 13   Date for PT Re-Evaluation 08/22/17   Authorization Type [employer] Other (30 visit limit)   Authorization Time Period 07/25/17 to 08/25/17   Authorization - Visit Number 5   Authorization - Number of Visits 30   PT Start Time 0900   PT Stop Time 0940   PT Time Calculation (min) 40 min   Activity Tolerance Patient tolerated treatment well   Behavior During Therapy [facility] for tasks assessed/performed      Past Medical History:  Diagnosis Date  . Arthritis    Right knee  . Chronic back pain 2008  . Diabetes mellitus without complication (HCC)   . GERD (gastroesophageal reflux disease)     Past Surgical History:  Procedure Laterality Date  . CARPAL TUNNEL RELEASE Right   . KNEE ARTHROSCOPY WITH LATERAL MENISECTOMY Right 05/26/2017   Procedure: KNEE ARTHROSCOPY WITH LATERAL MENISECTOMY;  Surgeon: Vickki HearingHarrison, Stanley E, MD;  Location: [facility];  Service: Orthopedics;  Laterality: Right;  . KNEE SURGERY Right   . TUBAL LIGATION      There were no vitals filed for this visit.      Subjective Assessment - 08/05/17 0859    Subjective Pt states that her knee is just a little stiff this morning, no pain. HEP is still going well.    Currently in Pain? No/denies            [facility] - 08/05/17 0001      AROM   Right Knee Extension 8  was 25   Right Knee Flexion 101  was 97     Strength   Right Hip Flexion 5/5  was 4+   Right Hip Extension 5/5  was 4+   Left Hip Extension 5/5  was 4+   Right Knee Flexion 5/5  was 4   Right Knee Extension 5/5  through available range; was 4+             [facility] Adult PT Treatment/Exercise - 08/05/17 0001      Knee/Hip Exercises: Stretches   LobbyistQuad Stretch Right;3 reps;30 seconds   Quad Stretch Limitations prone with rope   Gastroc Stretch Both;3 reps;30 seconds   Gastroc Stretch Limitations both with slant board     Knee/Hip Exercises: Aerobic   Recumbent Bike 3 mins at beginning of session for ROM     Knee/Hip Exercises: Standing   Terminal Knee Extension Limitations 10x5" with GTB     Knee/Hip Exercises: Supine   Quad Sets Right;15 reps   Quad Sets Limitations 5" holds     Manual Therapy   Manual Therapy Soft tissue mobilization   Manual therapy comments completed seperately from all other skilled interventions   Soft tissue mobilization medial > lateral joint line               PT Education - 08/05/17 0941    Education provided Yes   Education Details progress towards goals   Person(s) Educated Patient   Methods Explanation   Comprehension Verbalized understanding          PT Short  Term Goals - 08/05/17 0917      PT SHORT TERM GOAL #1   Title Patient to demonstrate R knee extension ROM as being 10 degrees in order to improve gait mechanics and reduce compensation pattern    Baseline 10/19: 8 deg this date   Time 2   Period Weeks   Status Achieved     PT SHORT TERM GOAL #2   Title Patient to demonstrate R knee flexion ROM as being 110 degrees in order to improve gait mechanics and reduce compensation pattern    Baseline 10/19: 101 deg   Time 2   Period Weeks   Status On-going     PT SHORT TERM GOAL #3   Title Patient to demonstrate consistent heel toe gait pattern, improved TKE, equal stance/swing phases, and elimiination of antalgic pattern in order to improve efficiency and quality of gait    Baseline 10/19: continued deviations   Time 2   Period Weeks   Status On-going     PT SHORT TERM GOAL #4   Title Patient to be compliant with correct performance of HEP, to be updated PRN     Time 1   Period Weeks   Status Achieved           PT Long Term Goals - 08/05/17 0917      PT LONG TERM GOAL #1   Title Patient to demonstrate R knee ROM as being 0-120 degrees in order to improve gait pattern and efficacy of movement    Baseline 10/19: 8-101 deg   Time 4   Period Weeks   Status On-going     PT LONG TERM GOAL #2   Title Patient to be able to reciprocally ascend/descend full flight of stairs with no railing, good eccentric control, in order to show improved home/community access    Baseline 10/19: ascends well but is not confident in descent reciprocal and demos min deviations with descent   Time 4   Period Weeks   Status On-going     PT LONG TERM GOAL #3   Title Patient to be able to tolerate closed chain activity for at least 90 minutes with R knee pain no more than 2/10 in order to assist in return to PLOF    Baseline 10/19: hasn't tried, but on average, she is on her feet comfortably for 45 mins   Time 4   Period Weeks   Status On-going               Plan - 08/05/17 0941    Clinical Impression Statement Pt states that she is returning to Dr. Romeo Apple on Monday so PT did a quick reassessment and goal check this date. Pt has made good progress towards her goals as illustrated above. Her AROM is still her main limiting factor as she was 8-101 this date. Pt also continues to demo difficulty with engaging her quad during quad sets and TKE. Ended session with manual to address her c/o pain at medial joint line and she reported feeling better at EOS. Continue POC as planned.   Rehab Potential Excellent   Clinical Impairments Affecting Rehab Potential (+) motivated, good outcomes with PT in the past, no major significant PMH   PT Frequency 3x / week   PT Duration 4 weeks   PT Treatment/Interventions ADLs/Self Care Home Management;Biofeedback;Cryotherapy;DME Instruction;Gait training;Stair training;Functional mobility training;Therapeutic  activities;Therapeutic exercise;Balance training;Neuromuscular re-education;Patient/family education;Manual techniques;Scar mobilization;Passive range of motion;Dry needling;Energy conservation;Taping   PT Next Visit Plan continue  with focus on edema control and ROM.  Quad activation with quad sets and decreasing restrictions. May try biodex PROM next session. continue prone quad stretch   PT Home Exercise Plan Eval: quad sets, weighted knee extension stretch, heel slides, knee flexion stretch, hamstring stretch    Consulted and Agree with Plan of Care Patient      Patient will benefit from skilled therapeutic intervention in order to improve the following deficits and impairments:  Abnormal gait, Increased fascial restricitons, Pain, Improper body mechanics, Decreased coordination, Decreased mobility, Increased muscle spasms, Postural dysfunction, Decreased scar mobility, Decreased activity tolerance, Decreased range of motion, Decreased strength, Difficulty walking, Increased edema, Impaired flexibility  Visit Diagnosis: Stiffness of right knee, not elsewhere classified  Acute pain of right knee  Difficulty in walking, not elsewhere classified  Localized edema     Problem List Patient Active Problem List   Diagnosis Date Noted  . Lateral meniscus, posterior horn derangement, right        Jac Canavan PT, DPT  [facility] [address] [place], [place], [address] Phone: [phone]   Fax:  [phone]  Name: Emily Simmons MRN: [id number] Date of Birth: 03-05-1976

## 2017-08-08 ENCOUNTER — Ambulatory Visit (HOSPITAL_COMMUNITY): Payer: BLUE CROSS/BLUE SHIELD | Admitting: Physical Therapy

## 2017-08-08 ENCOUNTER — Encounter: Payer: Self-pay | Admitting: Orthopedic Surgery

## 2017-08-08 ENCOUNTER — Ambulatory Visit (INDEPENDENT_AMBULATORY_CARE_PROVIDER_SITE_OTHER): Payer: BLUE CROSS/BLUE SHIELD | Admitting: Orthopedic Surgery

## 2017-08-08 VITALS — BP 114/77 | HR 88 | Ht 68.0 in | Wt 215.0 lb

## 2017-08-08 DIAGNOSIS — M25561 Pain in right knee: Secondary | ICD-10-CM

## 2017-08-08 DIAGNOSIS — M25661 Stiffness of right knee, not elsewhere classified: Secondary | ICD-10-CM | POA: Diagnosis not present

## 2017-08-08 DIAGNOSIS — Z9889 Other specified postprocedural states: Secondary | ICD-10-CM | POA: Insufficient documentation

## 2017-08-08 DIAGNOSIS — R262 Difficulty in walking, not elsewhere classified: Secondary | ICD-10-CM

## 2017-08-08 DIAGNOSIS — R600 Localized edema: Secondary | ICD-10-CM

## 2017-08-08 NOTE — Therapy (Signed)
[facility] [address] [place], [place], [address] Phone: [phone]   Fax:  [phone]  Physical Therapy Treatment  Patient Details  Name: Emily Simmons MRN: [id number] Date of Birth: [birth year] Referring Provider: Fuller CanadaStanley Harrison   Encounter Date: 08/08/2017      PT End of Session - 08/08/17 0939    Visit Number 6   Number of Visits 13   Date for PT Re-Evaluation 08/22/17   Authorization Type [employer] Other (30 visit limit)   Authorization Time Period 07/25/17 to 08/25/17   Authorization - Visit Number 6   Authorization - Number of Visits 30   PT Start Time 0906  bike not included in billing/patient had to leave early due to MD appt    PT Stop Time 0939   PT Time Calculation (min) 33 min   Activity Tolerance Patient tolerated treatment well   Behavior During Therapy [facility] for tasks assessed/performed      Past Medical History:  Diagnosis Date  . Arthritis    Right knee  . Chronic back pain 2008  . Diabetes mellitus without complication (HCC)   . GERD (gastroesophageal reflux disease)     Past Surgical History:  Procedure Laterality Date  . CARPAL TUNNEL RELEASE Right   . KNEE ARTHROSCOPY WITH LATERAL MENISECTOMY Right 05/26/2017   Procedure: KNEE ARTHROSCOPY WITH LATERAL MENISECTOMY;  Surgeon: Vickki HearingHarrison, Stanley E, MD;  Location: [facility];  Service: Orthopedics;  Laterality: Right;  . KNEE SURGERY Right   . TUBAL LIGATION      There were no vitals filed for this visit.      Subjective Assessment - 08/08/17 0902    Subjective Patient arrives stating her knee is feeling good, her knee is still stiff and tender but that is normal, HEP still challenging    Pertinent History chronic LBP, borderline DM, hx old R knee scope    Patient Stated Goals get back to normal, get back to PLOF, do a lot of walking    Currently in Pain? Yes   Pain Score 3    Pain Location Knee   Pain Orientation Right   Pain Descriptors / Indicators  Dull;Aching   Pain Type Chronic pain   Pain Radiating Towards none    Pain Onset More than a month ago   Pain Frequency Constant   Aggravating Factors  standing too long or sitting too long    Pain Relieving Factors staying off of it, ice, elevation                          [facility] Adult PT Treatment/Exercise - 08/08/17 0001      Knee/Hip Exercises: Stretches   Active Hamstring Stretch Right;3 reps;30 seconds   Knee: Self-Stretch to increase Flexion Right   Knee: Self-Stretch Limitations 12" box , 10 reps  3 second holds    Theme park managerGastroc Stretch Both;3 reps;30 seconds   Gastroc Stretch Limitations both with slant board     Knee/Hip Exercises: Aerobic   Recumbent Bike 5 min at beginning of session for ROM  not included in billing      Knee/Hip Exercises: Standing   Heel Raises Both;1 set;20 reps   Heel Raises Limitations toe and heel    Terminal Knee Extension Limitations x10, 3 second holds      Knee/Hip Exercises: Supine   Quad Sets Right;20 reps   Quad Sets Limitations 5" holds      Manual Therapy  Manual Therapy Edema management;Joint mobilization;Passive ROM   Manual therapy comments completed seperately from all other skilled interventions   Edema Management retrograde with LE elevated    Joint Mobilization patella mobility grade I-II all directions as tolerated    Soft tissue mobilization Medial HS ball assited    Passive ROM knee extension x5, 2 second holds   limited by guarding                 PT Education - 08/08/17 0939    Education provided Yes   Education Details HEp update    Person(s) Educated Patient   Methods Explanation;Handout   Comprehension Verbalized understanding;Returned demonstration;Need further instruction          PT Short Term Goals - 08/05/17 0917      PT SHORT TERM GOAL #1   Title Patient to demonstrate R knee extension ROM as being 10 degrees in order to improve gait mechanics and reduce compensation pattern     Baseline 10/19: 8 deg this date   Time 2   Period Weeks   Status Achieved     PT SHORT TERM GOAL #2   Title Patient to demonstrate R knee flexion ROM as being 110 degrees in order to improve gait mechanics and reduce compensation pattern    Baseline 10/19: 101 deg   Time 2   Period Weeks   Status On-going     PT SHORT TERM GOAL #3   Title Patient to demonstrate consistent heel toe gait pattern, improved TKE, equal stance/swing phases, and elimiination of antalgic pattern in order to improve efficiency and quality of gait    Baseline 10/19: continued deviations   Time 2   Period Weeks   Status On-going     PT SHORT TERM GOAL #4   Title Patient to be compliant with correct performance of HEP, to be updated PRN    Time 1   Period Weeks   Status Achieved           PT Long Term Goals - 08/05/17 0917      PT LONG TERM GOAL #1   Title Patient to demonstrate R knee ROM as being 0-120 degrees in order to improve gait pattern and efficacy of movement    Baseline 10/19: 8-101 deg   Time 4   Period Weeks   Status On-going     PT LONG TERM GOAL #2   Title Patient to be able to reciprocally ascend/descend full flight of stairs with no railing, good eccentric control, in order to show improved home/community access    Baseline 10/19: ascends well but is not confident in descent reciprocal and demos min deviations with descent   Time 4   Period Weeks   Status On-going     PT LONG TERM GOAL #3   Title Patient to be able to tolerate closed chain activity for at least 90 minutes with R knee pain no more than 2/10 in order to assist in return to PLOF    Baseline 10/19: hasn't tried, but on average, she is on her feet comfortably for 45 mins   Time 4   Period Weeks   Status On-going               Plan - 08/08/17 0940    Clinical Impression Statement Patient arrives today reporting she is doing well, her knee is just a bit stiff due to this cold morning, also requesting to  leave a few minutes early due  to MD appointment. Continued with bike for ROM at beginning of session, followed by ongoing interventions for knee ROM, flexibility, and pre-gait/balance. Patient sees her MD today, note goals were reviewed last session.    Rehab Potential Excellent   Clinical Impairments Affecting Rehab Potential (+) motivated, good outcomes with PT in the past, no major significant PMH   PT Frequency 3x / week   PT Duration 4 weeks   PT Treatment/Interventions ADLs/Self Care Home Management;Biofeedback;Cryotherapy;DME Instruction;Gait training;Stair training;Functional mobility training;Therapeutic activities;Therapeutic exercise;Balance training;Neuromuscular re-education;Patient/family education;Manual techniques;Scar mobilization;Passive range of motion;Dry needling;Energy conservation;Taping   PT Next Visit Plan continue with focus on edema control and ROM.  Quad activation with quad sets and decreasing restrictions. May try biodex PROM next session. continue prone quad stretch   PT Home Exercise Plan Eval: quad sets, weighted knee extension stretch, heel slides, knee flexion stretch, hamstring stretch    Consulted and Agree with Plan of Care Patient      Patient will benefit from skilled therapeutic intervention in order to improve the following deficits and impairments:  Abnormal gait, Increased fascial restricitons, Pain, Improper body mechanics, Decreased coordination, Decreased mobility, Increased muscle spasms, Postural dysfunction, Decreased scar mobility, Decreased activity tolerance, Decreased range of motion, Decreased strength, Difficulty walking, Increased edema, Impaired flexibility  Visit Diagnosis: Stiffness of right knee, not elsewhere classified  Acute pain of right knee  Difficulty in walking, not elsewhere classified  Localized edema     Problem List Patient Active Problem List   Diagnosis Date Noted  . S/P left knee arthroscopy 05/26/17  08/08/2017   . Lateral meniscus, posterior horn derangement, right     Nedra Hai PT, DPT [phone]  [facility] [address] [place], [place], [address] Phone: [phone]   Fax:  [phone]  Name: LAFREDA CASEBEER MRN: [id number] Date of Birth: Jan 15, 1976

## 2017-08-08 NOTE — Progress Notes (Signed)
Progress Note   Patient ID: Emily Simmons, female   DOB: 02-Aug-1976, 41 y.o.   MRN: [name]  Chief Complaint  Patient presents with  . Follow-up    Recheck on right knee, DOS 05-26-17.    HPI 41 year old female currently in physical therapy after knee arthroscopy  Surgical findings medial meniscus was normal, torn lateral meniscus with flipped bucket handle fragment into the notch  Main problem is regaining full extension ROS No outpatient prescriptions have been marked as taking for the 08/08/17 encounter (Office Visit) with Vickki HearingHarrison, Zayvier Caravello E, MD.    No Known Allergies   BP 114/77   Pulse 88   Ht 5\' 8"  (1.727 m)   Wt 215 lb (97.5 kg)   BMI 32.69 kg/m   Physical Exam  Right knee: full flexion is mild swelling in the joint. Flexion contracture slightly less than 10 Gen. appearance the patient's appearance is normal with normal grooming and  hygiene The patient is oriented to person place and time Mood and affect are normal  BP 114/77   Pulse 88   Ht 5\' 8"  (1.727 m)   Wt 215 lb (97.5 kg)   BMI 32.69 kg/m  Ortho Exam    Medical decision-making Encounter Diagnosis  Name Primary?  . S/P right knee arthroscopy 05/26/17 Yes    Continue therapy return in a month     Fuller CanadaStanley Allia Wiltsey, MD 08/08/2017 9:56 AM

## 2017-08-08 NOTE — Patient Instructions (Signed)
   SEATED HAMSTRING STRETCH  While seated, rest your heel on the floor with your knee straight and gently lean forward until a stretch is felt behind your knee/thigh. Do not hunch- think more about "hinging" at your hips.   Hold for at least 30 seconds, then relax.   Repeat 3-5 times with your right leg, twice a day

## 2017-08-10 ENCOUNTER — Ambulatory Visit (HOSPITAL_COMMUNITY): Payer: BLUE CROSS/BLUE SHIELD

## 2017-08-10 ENCOUNTER — Telehealth (HOSPITAL_COMMUNITY): Payer: Self-pay | Admitting: General Practice

## 2017-08-10 NOTE — Telephone Encounter (Signed)
08/10/17  pt called and only said she needed to cx today's appt, no reason was given

## 2017-08-12 ENCOUNTER — Encounter (HOSPITAL_COMMUNITY): Payer: Self-pay

## 2017-08-12 ENCOUNTER — Ambulatory Visit (HOSPITAL_COMMUNITY): Payer: BLUE CROSS/BLUE SHIELD

## 2017-08-12 DIAGNOSIS — M25561 Pain in right knee: Secondary | ICD-10-CM

## 2017-08-12 DIAGNOSIS — R262 Difficulty in walking, not elsewhere classified: Secondary | ICD-10-CM

## 2017-08-12 DIAGNOSIS — M25661 Stiffness of right knee, not elsewhere classified: Secondary | ICD-10-CM | POA: Diagnosis not present

## 2017-08-12 DIAGNOSIS — R600 Localized edema: Secondary | ICD-10-CM

## 2017-08-12 NOTE — Therapy (Signed)
[facility] [address] [place], [place], [address] Phone: [phone]   Fax:  [phone]  Physical Therapy Treatment  Patient Details  Name: Emily Simmons MRN: [id number] Date of Birth: 1976-06-19 Referring Provider: Fuller Canada   Encounter Date: 08/12/2017      PT End of Session - 08/12/17 0859    Visit Number 7   Number of Visits 13   Date for PT Re-Evaluation 08/22/17   Authorization Type [employer] Other (30 visit limit)   Authorization Time Period 07/25/17 to 08/25/17   Authorization - Visit Number 7   Authorization - Number of Visits 30   PT Start Time 0900   PT Stop Time 0941   PT Time Calculation (min) 41 min   Activity Tolerance Patient tolerated treatment well   Behavior During Therapy [facility] for tasks assessed/performed      Past Medical History:  Diagnosis Date  . Arthritis    Right knee  . Chronic back pain 2008  . Diabetes mellitus without complication (HCC)   . GERD (gastroesophageal reflux disease)     Past Surgical History:  Procedure Laterality Date  . CARPAL TUNNEL RELEASE Right   . KNEE ARTHROSCOPY WITH LATERAL MENISECTOMY Right 05/26/2017   Procedure: KNEE ARTHROSCOPY WITH LATERAL MENISECTOMY;  Surgeon: Vickki Hearing, MD;  Location: [facility];  Service: Orthopedics;  Laterality: Right;  . KNEE SURGERY Right   . TUBAL LIGATION      There were no vitals filed for this visit.      Subjective Assessment - 08/12/17 0859    Subjective Pt states that her MD appointment went well, he was pleased with her progress. She is out of work until 09/08/17. He told her to keep doing what she's doing. No pain this morning.    Pertinent History chronic LBP, borderline DM, hx old R knee scope    Patient Stated Goals get back to normal, get back to PLOF, do a lot of walking    Currently in Pain? No/denies   Pain Onset More than a month ago               [facility] Adult PT Treatment/Exercise - 08/12/17 0001       Knee/Hip Exercises: Stretches   Active Hamstring Stretch Right;3 reps;30 seconds   Active Hamstring Stretch Limitations 12" box    Quad Stretch Right;3 reps;30 seconds   Quad Stretch Limitations prone with rope   Knee: Self-Stretch to increase Flexion Right   Knee: Self-Stretch Limitations 12" box , 10 reps   Gastroc Stretch Both;3 reps;30 seconds   Gastroc Stretch Limitations on 6" step     Knee/Hip Exercises: Machines for Strengthening   Other Machine Biodex: 92% flexion, 92% extension (started at 80% but able to quickly increase)     Knee/Hip Exercises: Standing   Terminal Knee Extension Limitations 10x5" hold with BTB; TKE during gait with BTB x3RT     Knee/Hip Exercises: Supine   Knee Extension Limitations 8   Knee Flexion Limitations 103     Knee/Hip Exercises: Prone   Hamstring Curl 15 reps;3 seconds   Contract/Relax to Increase Flexion x3RT     Manual Therapy   Manual Therapy Joint mobilization;Soft tissue mobilization   Manual therapy comments completed seperately from all other skilled interventions   Joint Mobilization Grade III/IV A/P joint mobs for ROM   Soft tissue mobilization medial anterior joint line  PT Education - 08/12/17 0901    Education provided Yes   Education Details continue HEP and focus on SAQ   Person(s) Educated Patient   Methods Explanation;Demonstration;Handout   Comprehension Verbalized understanding;Returned demonstration          PT Short Term Goals - 08/05/17 0917      PT SHORT TERM GOAL #1   Title Patient to demonstrate R knee extension ROM as being 10 degrees in order to improve gait mechanics and reduce compensation pattern    Baseline 10/19: 8 deg this date   Time 2   Period Weeks   Status Achieved     PT SHORT TERM GOAL #2   Title Patient to demonstrate R knee flexion ROM as being 110 degrees in order to improve gait mechanics and reduce compensation pattern    Baseline 10/19: 101 deg   Time 2    Period Weeks   Status On-going     PT SHORT TERM GOAL #3   Title Patient to demonstrate consistent heel toe gait pattern, improved TKE, equal stance/swing phases, and elimiination of antalgic pattern in order to improve efficiency and quality of gait    Baseline 10/19: continued deviations   Time 2   Period Weeks   Status On-going     PT SHORT TERM GOAL #4   Title Patient to be compliant with correct performance of HEP, to be updated PRN    Time 1   Period Weeks   Status Achieved           PT Long Term Goals - 08/05/17 0917      PT LONG TERM GOAL #1   Title Patient to demonstrate R knee ROM as being 0-120 degrees in order to improve gait pattern and efficacy of movement    Baseline 10/19: 8-101 deg   Time 4   Period Weeks   Status On-going     PT LONG TERM GOAL #2   Title Patient to be able to reciprocally ascend/descend full flight of stairs with no railing, good eccentric control, in order to show improved home/community access    Baseline 10/19: ascends well but is not confident in descent reciprocal and demos min deviations with descent   Time 4   Period Weeks   Status On-going     PT LONG TERM GOAL #3   Title Patient to be able to tolerate closed chain activity for at least 90 minutes with R knee pain no more than 2/10 in order to assist in return to PLOF    Baseline 10/19: hasn't tried, but on average, she is on her feet comfortably for 45 mins   Time 4   Period Weeks   Status On-going               Plan - 08/12/17 1610    Clinical Impression Statement Session focused on improving overall functional strength, ROM, and mobility. Introduced pt to [employer] this date for PROM. She tolerated it well. Ended with manual to address soft tissue and joint restrictions. AROM 8-103 this date at [facility]. Continue POC as planned.   Rehab Potential Excellent   Clinical Impairments Affecting Rehab Potential (+) motivated, good outcomes with PT in the past, no major  significant PMH   PT Frequency 3x / week   PT Duration 4 weeks   PT Treatment/Interventions ADLs/Self Care Home Management;Biofeedback;Cryotherapy;DME Instruction;Gait training;Stair training;Functional mobility training;Therapeutic activities;Therapeutic exercise;Balance training;Neuromuscular re-education;Patient/family education;Manual techniques;Scar mobilization;Passive range of motion;Dry needling;Energy conservation;Taping   PT  Next Visit Plan continue with focus on edema control and ROM.  Quad activation with quad sets and decreasing restrictions. continue Biodex; continue prone quad stretch   PT Home Exercise Plan Eval: quad sets, weighted knee extension stretch, heel slides, knee flexion stretch, hamstring stretch    Consulted and Agree with Plan of Care Patient      Patient will benefit from skilled therapeutic intervention in order to improve the following deficits and impairments:  Abnormal gait, Increased fascial restricitons, Pain, Improper body mechanics, Decreased coordination, Decreased mobility, Increased muscle spasms, Postural dysfunction, Decreased scar mobility, Decreased activity tolerance, Decreased range of motion, Decreased strength, Difficulty walking, Increased edema, Impaired flexibility  Visit Diagnosis: Stiffness of right knee, not elsewhere classified  Acute pain of right knee  Difficulty in walking, not elsewhere classified  Localized edema     Problem List Patient Active Problem List   Diagnosis Date Noted  . S/P left knee arthroscopy 05/26/17  08/08/2017  . Lateral meniscus, posterior horn derangement, right        Emily Simmons PT, DPT  [facility] [address] [place], [place], [address] Phone: [phone]   Fax:  [phone]  Name: Emily Simmons MRN: [id number] Date of Birth: 07-Jan-1976

## 2017-08-17 ENCOUNTER — Encounter (HOSPITAL_COMMUNITY): Payer: Self-pay

## 2017-08-17 ENCOUNTER — Ambulatory Visit (HOSPITAL_COMMUNITY): Payer: BLUE CROSS/BLUE SHIELD

## 2017-08-17 DIAGNOSIS — R600 Localized edema: Secondary | ICD-10-CM

## 2017-08-17 DIAGNOSIS — R262 Difficulty in walking, not elsewhere classified: Secondary | ICD-10-CM

## 2017-08-17 DIAGNOSIS — M25561 Pain in right knee: Secondary | ICD-10-CM

## 2017-08-17 DIAGNOSIS — M25661 Stiffness of right knee, not elsewhere classified: Secondary | ICD-10-CM | POA: Diagnosis not present

## 2017-08-17 NOTE — Therapy (Signed)
[facility] [address] [place], [place], [address] Phone: [phone]   Fax:  [phone]  Physical Therapy Treatment  Patient Details  Name: Emily Simmons MRN: [id number] Date of Birth: [birth year] Referring Provider: Fuller Canada   Encounter Date: 08/17/2017      PT End of Session - 08/17/17 0826    Visit Number 8   Number of Visits 13   Date for PT Re-Evaluation 08/22/17   Authorization Type BC[employer] Other (30 visit limit)   Authorization Time Period 07/25/17 to 08/25/17   Authorization - Visit Number 8   Authorization - Number of Visits 30   PT Start Time 0817   PT Stop Time 0900   PT Time Calculation (min) 43 min   Activity Tolerance Patient tolerated treatment well   Behavior During Therapy [facility] for tasks assessed/performed      Past Medical History:  Diagnosis Date  . Arthritis    Right knee  . Chronic back pain 2008  . Diabetes mellitus without complication (HCC)   . GERD (gastroesophageal reflux disease)     Past Surgical History:  Procedure Laterality Date  . CARPAL TUNNEL RELEASE Right   . KNEE ARTHROSCOPY WITH LATERAL MENISECTOMY Right 05/26/2017   Procedure: KNEE ARTHROSCOPY WITH LATERAL MENISECTOMY;  Surgeon: Vickki Hearing, MD;  Location: [facility];  Service: Orthopedics;  Laterality: Right;  . KNEE SURGERY Right   . TUBAL LIGATION      There were no vitals filed for this visit.      Subjective Assessment - 08/17/17 0825    Subjective Pt stated her knee is feeling good, mainly stiffness today.  reports compliance wtih HEP.     Patient Stated Goals get back to normal, get back to PLOF, do a lot of walking    Currently in Pain? No/denies   Pain Score 0-No pain   Pain Location Knee   Pain Orientation Right   Pain Descriptors / Indicators --  stiffness                         [facility] Adult PT Treatment/Exercise - 08/17/17 0001      Knee/Hip Exercises: Stretches   Active Hamstring  Stretch Right;3 reps;30 seconds   Active Hamstring Stretch Limitations supine with rope   Quad Stretch Right;3 reps;30 seconds   Quad Stretch Limitations prone with rope   Knee: Self-Stretch to increase Flexion Right   Knee: Self-Stretch Limitations 12" box , 10 reps   Gastroc Stretch Both;3 reps;30 seconds   Gastroc Stretch Limitations slant board     Knee/Hip Exercises: Aerobic   Recumbent Bike 3 min at beginning of session for ROM seat8     Knee/Hip Exercises: Machines for Strengthening   Other Machine Biodex PROM: 94% flexion, 94% extension      Knee/Hip Exercises: Standing   Terminal Knee Extension Limitations 15x5" hold wiht BT     Knee/Hip Exercises: Supine   Short Arc [employer] 15 reps   Short Arc Quad Sets Limitations 5" holds    Patellar Mobs complete   Knee Extension Limitations 8   Knee Flexion Limitations 104     Knee/Hip Exercises: Prone   Hamstring Curl 15 reps;3 seconds   Contract/Relax to Increase Flexion 5x 10x holds     Manual Therapy   Manual Therapy Joint mobilization;Soft tissue mobilization   Manual therapy comments completed seperately from all other skilled interventions   Joint Mobilization patella mobs  all directions;tib/fib grade III/IV   Soft tissue mobilization medial anterior joint line   Passive ROM contract relax for flexion prone; PROM supine                  PT Short Term Goals - 08/05/17 0917      PT SHORT TERM GOAL #1   Title Patient to demonstrate R knee extension ROM as being 10 degrees in order to improve gait mechanics and reduce compensation pattern    Baseline 10/19: 8 deg this date   Time 2   Period Weeks   Status Achieved     PT SHORT TERM GOAL #2   Title Patient to demonstrate R knee flexion ROM as being 110 degrees in order to improve gait mechanics and reduce compensation pattern    Baseline 10/19: 101 deg   Time 2   Period Weeks   Status On-going     PT SHORT TERM GOAL #3   Title Patient to demonstrate  consistent heel toe gait pattern, improved TKE, equal stance/swing phases, and elimiination of antalgic pattern in order to improve efficiency and quality of gait    Baseline 10/19: continued deviations   Time 2   Period Weeks   Status On-going     PT SHORT TERM GOAL #4   Title Patient to be compliant with correct performance of HEP, to be updated PRN    Time 1   Period Weeks   Status Achieved           PT Long Term Goals - 08/05/17 0917      PT LONG TERM GOAL #1   Title Patient to demonstrate R knee ROM as being 0-120 degrees in order to improve gait pattern and efficacy of movement    Baseline 10/19: 8-101 deg   Time 4   Period Weeks   Status On-going     PT LONG TERM GOAL #2   Title Patient to be able to reciprocally ascend/descend full flight of stairs with no railing, good eccentric control, in order to show improved home/community access    Baseline 10/19: ascends well but is not confident in descent reciprocal and demos min deviations with descent   Time 4   Period Weeks   Status On-going     PT LONG TERM GOAL #3   Title Patient to be able to tolerate closed chain activity for at least 90 minutes with R knee pain no more than 2/10 in order to assist in return to PLOF    Baseline 10/19: hasn't tried, but on average, she is on her feet comfortably for 45 mins   Time 4   Period Weeks   Status On-going               Plan - 08/17/17 1400    Clinical Impression Statement Continued session focus wiht knee mobility.  Began session on bike (seat 8, no charge) for mobility prior active stretches and quad strengthening.  Pt tolerated well with no reports of pain through session.  Able to increased degrees with Biodex PROM to 94% flexion and extension.  AROM 8-104 at EOS.     Rehab Potential Excellent   Clinical Impairments Affecting Rehab Potential (+) motivated, good outcomes with PT in the past, no major significant PMH   PT Frequency 3x / week   PT Duration 4  weeks   PT Treatment/Interventions ADLs/Self Care Home Management;Biofeedback;Cryotherapy;DME Instruction;Gait training;Stair training;Functional mobility training;Therapeutic activities;Therapeutic exercise;Balance training;Neuromuscular re-education;Patient/family education;Manual techniques;Scar mobilization;Passive range  of motion;Dry needling;Energy conservation;Taping   PT Next Visit Plan Resume prone knee hang with manual for extension and focus on edema control and ROM.  Quad activation with quad sets and decreasing restrictions. continue Biodex; continue prone quad stretch   PT Home Exercise Plan Eval: quad sets, weighted knee extension stretch, heel slides, knee flexion stretch, hamstring stretch       Patient will benefit from skilled therapeutic intervention in order to improve the following deficits and impairments:  Abnormal gait, Increased fascial restricitons, Pain, Improper body mechanics, Decreased coordination, Decreased mobility, Increased muscle spasms, Postural dysfunction, Decreased scar mobility, Decreased activity tolerance, Decreased range of motion, Decreased strength, Difficulty walking, Increased edema, Impaired flexibility  Visit Diagnosis: Stiffness of right knee, not elsewhere classified  Acute pain of right knee  Difficulty in walking, not elsewhere classified  Localized edema     Problem List Patient Active Problem List   Diagnosis Date Noted  . S/P left knee arthroscopy 05/26/17  08/08/2017  . Lateral meniscus, posterior horn derangement, right    Becky Sax, LPTA; CBIS [phone]  Juel Burrow 08/17/2017, 2:04 PM  [facility] [address] [place], [place], [address] Phone: [phone]   Fax:  [phone]  Name: Emily Simmons MRN: [id number] Date of Birth: 1976-06-11

## 2017-08-19 ENCOUNTER — Ambulatory Visit (HOSPITAL_COMMUNITY): Payer: BLUE CROSS/BLUE SHIELD | Attending: Orthopedic Surgery

## 2017-08-19 ENCOUNTER — Encounter (HOSPITAL_COMMUNITY): Payer: Self-pay

## 2017-08-19 DIAGNOSIS — R262 Difficulty in walking, not elsewhere classified: Secondary | ICD-10-CM | POA: Insufficient documentation

## 2017-08-19 DIAGNOSIS — M25561 Pain in right knee: Secondary | ICD-10-CM | POA: Insufficient documentation

## 2017-08-19 DIAGNOSIS — Z9889 Other specified postprocedural states: Secondary | ICD-10-CM | POA: Insufficient documentation

## 2017-08-19 DIAGNOSIS — M23351 Other meniscus derangements, posterior horn of lateral meniscus, right knee: Secondary | ICD-10-CM | POA: Diagnosis present

## 2017-08-19 DIAGNOSIS — R600 Localized edema: Secondary | ICD-10-CM | POA: Insufficient documentation

## 2017-08-19 DIAGNOSIS — M25661 Stiffness of right knee, not elsewhere classified: Secondary | ICD-10-CM | POA: Insufficient documentation

## 2017-08-19 NOTE — Therapy (Signed)
[facility] [address] [place], [place], [address] Phone: [phone]   Fax:  [phone]  Physical Therapy Treatment  Patient Details  Name: Emily Simmons MRN: [id number] Date of Birth: [birth year] Referring Provider: Fuller CanadaStanley Harrison   Encounter Date: 08/19/2017      PT End of Session - 08/19/17 0951    Visit Number 9   Number of Visits 13   Date for PT Re-Evaluation 08/22/17   Authorization Type BCBS Other (30 visit limit)   Authorization Time Period 07/25/17 to 08/25/17   Authorization - Visit Number 9   Authorization - Number of Visits 30   PT Start Time 0817   PT Stop Time 0855   PT Time Calculation (min) 38 min   Activity Tolerance Patient tolerated treatment well   Behavior During Therapy [facility] for tasks assessed/performed      Past Medical History:  Diagnosis Date  . Arthritis    Right knee  . Chronic back pain 2008  . Diabetes mellitus without complication (HCC)   . GERD (gastroesophageal reflux disease)     Past Surgical History:  Procedure Laterality Date  . CARPAL TUNNEL RELEASE Right   . KNEE ARTHROSCOPY WITH LATERAL MENISECTOMY Right 05/26/2017   Procedure: KNEE ARTHROSCOPY WITH LATERAL MENISECTOMY;  Surgeon: Vickki HearingHarrison, Stanley E, MD;  Location: [facility];  Service: Orthopedics;  Laterality: Right;  . KNEE SURGERY Right   . TUBAL LIGATION      There were no vitals filed for this visit.      Subjective Assessment - 08/19/17 0820    Subjective Pt denies pain and states that "it's just stiff every morning,"   Currently in Pain? No/denies                         [facility]/Ex[facility] - 08/19/17 0001      Knee/Hip Exercises: Aerobic   Recumbent Bike 3 min at beginning of session for ROM seat8     Knee/Hip Exercises: Machines for Strengthening   Other Machine Bi[employer]x PROM: 96% flexion, 96% extension      Knee/Hip Exercises: Supine   Quad Sets Limitations unable to get quad  contraction   Short Arc [employer]Quad Sets Right;10 reps;2 sets   [employer] Quad Sets Limitations 5" hold, 3#   Straight Leg Raises Right;2 sets;10 reps   Straight Leg Raises Limitations 5" hold   Knee Extension Limitations 6   Knee Flexion Limitations 108                  PT Short Term Goals - 08/05/17 0917      PT SHORT TERM GOAL #1   Title Patient to demonstrate R knee extension ROM as being 10 degrees in order to improve gait mechanics and reduce compensation pattern    Baseline 10/19: 8 deg this date   Time 2   Period Weeks   Status Achieved     PT SHORT TERM GOAL #2   Title Patient to demonstrate R knee flexion ROM as being 110 degrees in order to improve gait mechanics and reduce compensation pattern    Baseline 10/19: 101 deg   Time 2   Period Weeks   Status On-going     PT SHORT TERM GOAL #3   Title Patient to demonstrate consistent heel toe gait pattern, improved TKE, equal stance/swing phases, and elimiination of antalgic pattern in order to improve efficiency and quality of gait  Baseline 10/19: continued deviations   Time 2   Period Weeks   Status On-going     PT SHORT TERM GOAL #4   Title Patient to be compliant with correct performance of HEP, to be updated PRN    Time 1   Period Weeks   Status Achieved           PT Long Term Goals - 08/05/17 0917      PT LONG TERM GOAL #1   Title Patient to demonstrate R knee ROM as being 0-120 degrees in order to improve gait pattern and efficacy of movement    Baseline 10/19: 8-101 deg   Time 4   Period Weeks   Status On-going     PT LONG TERM GOAL #2   Title Patient to be able to reciprocally ascend/descend full flight of stairs with no railing, good eccentric control, in order to show improved home/community access    Baseline 10/19: ascends well but is not confident in descent reciprocal and demos min deviations with descent   Time 4   Period Weeks   Status On-going     PT LONG TERM GOAL #3   Title  Patient to be able to tolerate closed chain activity for at least 90 minutes with R knee pain no more than 2/10 in order to assist in return to PLOF    Baseline 10/19: hasn't tried, but on average, she is on her feet comfortably for 45 mins   Time 4   Period Weeks   Status On-going               Plan - 08/19/17 0951    Clinical Impression Statement Patient continues to present with stiffness that is decreased with bike warm up x 3 minutes (not charged). Patient tolerated addition of 2% with biodex today. Manual for swelling was not completed secondary to patient attire, however, contract relax technique for knee ROM was completed and continues to benefit patient. Session focused primarily on quad contraction and mobility. Encouraged patient to continue HEP and add weight to the SAQs. Overall, tolerated well and assessment should be completed next session.    Rehab Potential Excellent   Clinical Impairments Affecting Rehab Potential (+) motivated, good outcomes with PT in the past, no major significant PMH   PT Frequency 3x / week   PT Duration 4 weeks   PT Treatment/Interventions ADLs/Self Care Home Management;Biofeedback;Cryotherapy;DME Instruction;Gait training;Stair training;Functional mobility training;Therapeutic activities;Therapeutic exercise;Balance training;Neuromuscular re-education;Patient/family education;Manual techniques;Scar mobilization;Passive range of motion;Dry needling;Energy conservation;Taping   PT Next Visit Plan Resume prone knee hang with manual for extension and focus on edema control and ROM.  Quad activation with quad sets and decreasing restrictions. continue Biodex; continue prone quad stretch. potentially trial [place] stimulation for quad contraction during SAQ and SLR supine   PT Home Exercise Plan Eval: quad sets, weighted knee extension stretch, heel slides, knee flexion stretch, hamstring stretch       Patient will benefit from skilled therapeutic  intervention in order to improve the following deficits and impairments:  Abnormal gait, Increased fascial restricitons, Pain, Improper body mechanics, Decreased coordination, Decreased mobility, Increased muscle spasms, Postural dysfunction, Decreased scar mobility, Decreased activity tolerance, Decreased range of motion, Decreased strength, Difficulty walking, Increased edema, Impaired flexibility  Visit Diagnosis: Stiffness of right knee, not elsewhere classified  Acute pain of right knee  Difficulty in walking, not elsewhere classified  Localized edema     Problem List Patient Active Problem List   Diagnosis Date  Noted  . S/P left knee arthroscopy 05/26/17  08/08/2017  . Lateral meniscus, posterior horn derangement, right    Candise Che PT, DPT 10:00 AM, 08/19/17 [phone]  [facility] [address] [place], [place], [address] Phone: [phone]   Fax:  [phone]  Name: Emily Simmons MRN: [id number] Date of Birth: Sep 17, 1976

## 2017-08-22 ENCOUNTER — Ambulatory Visit (HOSPITAL_COMMUNITY): Payer: BLUE CROSS/BLUE SHIELD

## 2017-08-22 DIAGNOSIS — M23351 Other meniscus derangements, posterior horn of lateral meniscus, right knee: Secondary | ICD-10-CM

## 2017-08-22 DIAGNOSIS — Z9889 Other specified postprocedural states: Secondary | ICD-10-CM

## 2017-08-22 DIAGNOSIS — M25661 Stiffness of right knee, not elsewhere classified: Secondary | ICD-10-CM | POA: Diagnosis not present

## 2017-08-22 NOTE — Therapy (Addendum)
[facility] [address] [place], [place], [address] Phone: [phone]   Fax:  [phone]  Physical Therapy Treatment  Patient Details  Name: Emily Simmons MRN: [id number] Date of Birth: Feb 14, 1976 Referring Provider: Fuller Canada    Encounter Date: 08/22/2017  PT End of Session - 08/22/17 0942    Visit Number  10    Number of Visits  13    Date for PT Re-Evaluation  08/22/17 done 11/5    done 11/5    Authorization Type  BCBS Other (30 visit limit)    Authorization Time Period  07/25/17 to 08/25/17 (needs recert)     Authorization - Visit Number  10    Authorization - Number of Visits  30    PT Start Time  0903    PT Stop Time  0946    PT Time Calculation (min)  43 min    Activity Tolerance  Patient tolerated treatment well;Patient limited by pain    Behavior During Therapy  [facility] for tasks assessed/performed       Past Medical History:  Diagnosis Date  . Arthritis    Right knee  . Chronic back pain 2008  . Diabetes mellitus without complication (HCC)   . GERD (gastroesophageal reflux disease)     Past Surgical History:  Procedure Laterality Date  . CARPAL TUNNEL RELEASE Right   . KNEE SURGERY Right   . TUBAL LIGATION      There were no vitals filed for this visit.  Subjective Assessment - 08/22/17 0907    Subjective  Pt reports seh notices big differences since starting PT> She can walk better. She has improved toelrance to standing fo rkitchen activity due to better knee ROM. She says the knee bends more easily.     Pertinent History  chronic LBP, borderline DM, hx old R knee scope     How long can you sit comfortably?  2.5-3 hour; 60 minutes at eval    How long can you stand comfortably?  90 minutes; 10-15 minutes at eval    How long can you walk comfortably?  60 minutes; 10-15 minutes at eval     Patient Stated Goals  get back to normal, get back to PLOF, do a lot of walking     Currently in Pain?  No/denies no  pain, just stiffnes   no pain, just stiffnes        [facility] PT Assessment - 08/22/17 0001      Assessment   Medical Diagnosis  s/p R knee scope     Onset Date/Surgical Date  05/26/17    Next MD Visit  Dr. Romeo Apple  09/08/17   09/08/17   Prior Therapy  none       Precautions   Precautions  None      Restrictions   Weight Bearing Restrictions  No      Balance Screen   Has the patient fallen in the past 6 months  No    Has the patient had a decrease in activity level because of a fear of falling?   No    Is the patient reluctant to leave their home because of a fear of falling?   No      AROM   Right Knee Extension  16 25 degrees at eval (8 degrees on 10/19)   25 degrees at eval (8 degrees on 10/19)   Right Knee Flexion  95 97 at eval (101 on 10/19)  97 at eval (101 on 10/19)     Strength   Right Hip Flexion  5/5 was 4+   was 4+   Right Hip Extension  5/5 was 4+   was 4+   Left Hip Extension  5/5 was 4+   was 4+   Right Knee Flexion  5/5 was 4   was 4   Right Knee Extension  5/5 through available range; was 4+   through available range; was 4+     Palpation   Patella mobility  hypomobile                  [facility] Adult PT Treatment/Exercise - 08/22/17 0001      Ambulation/Gait   Ambulation Distance (Feet)  450 Feet    Gait velocity  1.2164m/s    Gait Comments  lacks significant terminal knee extension ROM      Knee/Hip Exercises: Supine   Short Arc Quad Sets  Strengthening;Right;3 sets 5lb, 7.5lb, 2x10lb   5lb, 7.5lb, 2x10lb   Heel Slides  AAROM 5 minutes with nuStep for self assist   5 minutes with nuStep for self assist   Bridges  15 reps;3 sets;Strengthening hamstrings/glute max activation   hamstrings/glute max activation   Knee Extension Limitations  16    Knee Flexion Limitations  96      Modalities   Modalities  Electrical Stimulation      Electrical Stimulation   Electrical Stimulation Location  Right knee     Electrical Stimulation  Action  Sensory TENS     Electrical Stimulation Goals  -- neurological dysinhibition: 15 minutes concurren twith Exerc   neurological dysinhibition: 15 minutes concurren twith Exerc     Manual Therapy   Manual Therapy  Joint mobilization;Soft tissue mobilization    Joint Mobilization  patella mobs all directions;tib/fem grade III/IV               PT Short Term Goals - 08/05/17 0917      PT SHORT TERM GOAL #1   Title  Patient to demonstrate R knee extension ROM as being 10 degrees in order to improve gait mechanics and reduce compensation pattern     Baseline  10/19: 8 deg this date    Time  2    Period  Weeks    Status  Achieved      PT SHORT TERM GOAL #2   Title  Patient to demonstrate R knee flexion ROM as being 110 degrees in order to improve gait mechanics and reduce compensation pattern     Baseline  10/19: 101 deg    Time  2    Period  Weeks    Status  On-going      PT SHORT TERM GOAL #3   Title  Patient to demonstrate consistent heel toe gait pattern, improved TKE, equal stance/swing phases, and elimiination of antalgic pattern in order to improve efficiency and quality of gait     Baseline  10/19: continued deviations    Time  2    Period  Weeks    Status  On-going      PT SHORT TERM GOAL #4   Title  Patient to be compliant with correct performance of HEP, to be updated PRN     Time  1    Period  Weeks    Status  Achieved        PT Long Term Goals - 08/22/17 [id number]      PT LONG TERM  GOAL #1   Title  Patient to demonstrate R knee ROM as being 0-120 degrees in order to improve gait pattern and efficacy of movement     Baseline  16-96 degrees on 11/5             Plan - 08/22/17 0945    Clinical Impression Statement  Reassessment done today. Strength AEB by MMT is largely improved. Capsular restrictions to right kne ROM are only minimally improved since beginning PT, and today are nearly unchanged due to a very active weekend: pt subjectively  reporting increased stiffness and difficulty walking. Muscle spasm does not appear to be a contributor to joint limitations at this time.     History and Personal Factors relevant to plan of care:  history of ACL rupture/repair 20ya     Clinical Presentation  Stable    Rehab Potential  Fair    Clinical Impairments Affecting Rehab Potential  (+) motivated, good outcomes with PT in the past, no major significant PMH    PT Frequency  3x / week    PT Duration  4 weeks    PT Treatment/Interventions  ADLs/Self Care Home Management;Biofeedback;Cryotherapy;DME Instruction;Gait training;Stair training;Functional mobility training;Therapeutic activities;Therapeutic exercise;Balance training;Neuromuscular re-education;Patient/family education;Manual techniques;Scar mobilization;Passive range of motion;Dry needling;Energy conservation;Taping    PT Next Visit Plan  continue to work on ROM, consider connectign with ortho to determine course of active given minimal progress in range with atypical presentation of knee restriction.     PT Home Exercise Plan  Eval: quad sets, weighted knee extension stretch, heel slides, knee flexion stretch, hamstring stretch     Consulted and Agree with Plan of Care  Patient       Patient will benefit from skilled therapeutic intervention in order to improve the following deficits and impairments:  Abnormal gait, Increased fascial restricitons, Pain, Improper body mechanics, Decreased coordination, Decreased mobility, Increased muscle spasms, Postural dysfunction, Decreased scar mobility, Decreased activity tolerance, Decreased range of motion, Decreased strength, Difficulty walking, Increased edema, Impaired flexibility  Visit Diagnosis: Lateral meniscus, posterior horn derangement, right  S/P left knee arthroscopy     Problem List Patient Active Problem List   Diagnosis Date Noted  . S/P left knee arthroscopy 05/26/17  08/08/2017  . Lateral meniscus, posterior horn  derangement, right    12:58 PM, 08/22/17 Rosamaria Lints, PT, DPT Physical Therapist - [place] [phone] (Office)   Rosamaria Lints 08/22/2017, 12:58 PM  [place] [facility] [address] [place], [place], [address] Phone: [phone]   Fax:  [phone]  Name: LATISIA HILAIRE MRN: [id number] Date of Birth: 22-Aug-1976

## 2017-08-24 ENCOUNTER — Ambulatory Visit (HOSPITAL_COMMUNITY): Payer: BLUE CROSS/BLUE SHIELD

## 2017-08-24 ENCOUNTER — Encounter (HOSPITAL_COMMUNITY): Payer: Self-pay

## 2017-08-24 DIAGNOSIS — M23351 Other meniscus derangements, posterior horn of lateral meniscus, right knee: Secondary | ICD-10-CM

## 2017-08-24 DIAGNOSIS — Z9889 Other specified postprocedural states: Secondary | ICD-10-CM

## 2017-08-24 DIAGNOSIS — R262 Difficulty in walking, not elsewhere classified: Secondary | ICD-10-CM

## 2017-08-24 DIAGNOSIS — M25661 Stiffness of right knee, not elsewhere classified: Secondary | ICD-10-CM | POA: Diagnosis not present

## 2017-08-24 DIAGNOSIS — M25561 Pain in right knee: Secondary | ICD-10-CM

## 2017-08-24 DIAGNOSIS — R600 Localized edema: Secondary | ICD-10-CM

## 2017-08-24 NOTE — Therapy (Signed)
[facility] [address] [place], [place], [address] Phone: [phone]   Fax:  [phone]  Physical Therapy Treatment  Patient Details  Name: Emily Simmons MRN: [id number] Date of Birth: [birth year] Referring Provider: Fuller Canada    Encounter Date: 08/24/2017  PT End of Session - 08/24/17 0949    Visit Number  11    Number of Visits  25    Date for PT Re-Evaluation  09/21/17 done 11/5    done 11/5    Authorization Type  [employer] Other (30 visit limit)    Authorization Time Period  07/25/17 to 08/25/17; NEW: 08/24/17 to 09/21/17    Authorization - Visit Number  11    Authorization - Number of Visits  30    PT Start Time  0947    PT Stop Time  1028    PT Time Calculation (min)  41 min    Activity Tolerance  Patient tolerated treatment well;Patient limited by pain    Behavior During Therapy  [facility] for tasks assessed/performed       Past Medical History:  Diagnosis Date  . Arthritis    Right knee  . Chronic back pain 2008  . Diabetes mellitus without complication (HCC)   . GERD (gastroesophageal reflux disease)     Past Surgical History:  Procedure Laterality Date  . CARPAL TUNNEL RELEASE Right   . KNEE SURGERY Right   . TUBAL LIGATION      There were no vitals filed for this visit.  Subjective Assessment - 08/24/17 0949    Subjective  Pt states that she feels better than she did Monday. She only reports a little bit of stiffness, no pain.    Pertinent History  chronic LBP, borderline DM, hx old R knee scope     How long can you sit comfortably?  2.5-3 hour; 60 minutes at eval    How long can you stand comfortably?  90 minutes; 10-15 minutes at eval    How long can you walk comfortably?  60 minutes; 10-15 minutes at eval     Patient Stated Goals  get back to normal, get back to PLOF, do a lot of walking     Currently in Pain?  No/denies           [facility] PT Assessment - 08/24/17 0001      Assessment   Medical  Diagnosis  s/p R knee scope     Onset Date/Surgical Date  05/26/17    Next MD Visit  Dr. Romeo Apple  09/08/17   09/08/17   Prior Therapy  none       AROM   Right Knee Extension  13 25 deg at eval, 8 deg on 10/19   25 deg at eval, 8 deg on 10/19   Right Knee Flexion  98 97 deg at eval, 101 deg at 10/19   97 deg at eval, 101 deg at 10/19     Strength   Right Hip Flexion  5/5 was 4+   was 4+   Right Hip Extension  5/5 was 4+   was 4+   Left Hip Extension  5/5 was 4+   was 4+   Right Knee Flexion  5/5 was 4   was 4   Right Knee Extension  5/5 through available range; was 4+   through available range; was 4+     Palpation   Patella mobility  hypomobile      Ambulation/Gait  Ambulation Distance (Feet)  450 Feet    Gait velocity  1.3448m/s    Gait Comments  lacks significant terminal knee extension ROM           [facility] Adult PT Treatment/Exercise - 08/24/17 0001      Knee/Hip Exercises: Stretches   Passive Hamstring Stretch  Right;3 reps;30 seconds      Knee/Hip Exercises: Aerobic   Recumbent Bike  3 min at beginning of session for ROM seat8      Knee/Hip Exercises: Standing   Step Down  Right;15 reps;Hand Hold: 1;Step Height: 8"    Other Standing Knee Exercises  heel taps on 8" step and TKE at top with GTB 3x10 reps      Knee/Hip Exercises: Supine   Short Arc Quad Sets  Strengthening;Right;3 sets    Short Arc Quad Sets Limitations  5# 1st set, 8# 2nd set, 11# 3rd set    Knee Extension  AROM    Knee Extension Limitations  13 at start of session, 13 at end of session    Knee Flexion  AROM    Knee Flexion Limitations  98 at start of session; 100 at end of session       Manual Therapy   Manual Therapy  Joint mobilization    Manual therapy comments  completed seperately from all other skilled interventions    Joint Mobilization  A/P tibiofemoral grade 3/4, A/P proximal tibfib, 10-15 bouts for 10-15 seconds, patellar mobility all planes           PT Education -  08/24/17 0949    Education provided  Yes    Education Details  hold off on performing HEP for a few days; POC, decrease frequency to 2x/week for 4 more weeks    Person(s) Educated  Patient    Methods  Explanation;Demonstration    Comprehension  Verbalized understanding;Returned demonstration       PT Short Term Goals - 08/24/17 1043      PT SHORT TERM GOAL #1   Title  Patient to demonstrate R knee extension ROM as being 10 degrees in order to improve gait mechanics and reduce compensation pattern     Baseline  11/7: 13 deg this date    Time  2    Period  Weeks    Status  On-going      PT SHORT TERM GOAL #2   Title  Patient to demonstrate R knee flexion ROM as being 110 degrees in order to improve gait mechanics and reduce compensation pattern     Baseline  11/7: 100    Time  2    Period  Weeks    Status  On-going      PT SHORT TERM GOAL #3   Title  Patient to demonstrate consistent heel toe gait pattern, improved TKE, equal stance/swing phases, and elimiination of antalgic pattern in order to improve efficiency and quality of gait     Baseline  11/7: continued deviations    Time  2    Period  Weeks    Status  On-going      PT SHORT TERM GOAL #4   Title  Patient to be compliant with correct performance of HEP, to be updated PRN     Time  1    Period  Weeks    Status  Achieved        PT Long Term Goals - 08/24/17 1043      PT LONG TERM GOAL #1  Title  Patient to demonstrate R knee ROM as being 0-120 degrees in order to improve gait pattern and efficacy of movement     Baseline  11/7: 13-100 degrees (16-96 on 11/5)    Time  4    Period  Weeks    Status  On-going      PT LONG TERM GOAL #2   Title  Patient to be able to reciprocally ascend/descend full flight of stairs with no railing, good eccentric control, in order to show improved home/community access     Baseline  11/7: ascends well but is not confident in descent reciprocal and demos min deviations with descent     Time  4    Period  Weeks    Status  On-going      PT LONG TERM GOAL #3   Title  Patient to be able to tolerate closed chain activity for at least 90 minutes with R knee pain no more than 2/10 in order to assist in return to PLOF     Baseline  11/7: hasn't tried, but on average, she is on her feet comfortably for 45 mins    Time  4    Period  Weeks    Status  On-going            Plan - 08/24/17 1033    Clinical Impression Statement  Formal reassessment performed last treatment session so did not complete this date. Pt's ROM still limited today as she was 13-98 at beginning of session; at end of session following manual and therex, her flexion improved to 100, but her extension remained at 13. Pt reported doing her exercieses every 3 hours over the weekend, which she felt contributed to her increased swelilng, pain, and deficits in ROM. PT encouraged pt to hold off on her HEP for the next few days to give her knee some time to recover. The PT who performed the reassessment last visit has contacted her MD regarding her deficits in ROM. Recommend continuation of skilled PT services for 2x/week for 4 more weeks to address deficits in ROM and to begin training in functional tasks in order to promote return to PLOF and overall QOL.     Rehab Potential  Fair    Clinical Impairments Affecting Rehab Potential  (+) motivated, good outcomes with PT in the past, no major significant PMH    PT Frequency  2x / week    PT Duration  4 weeks    PT Treatment/Interventions  ADLs/Self Care Home Management;Biofeedback;Cryotherapy;DME Instruction;Gait training;Stair training;Functional mobility training;Therapeutic activities;Therapeutic exercise;Balance training;Neuromuscular re-education;Patient/family education;Manual techniques;Scar mobilization;Passive range of motion;Dry needling;Energy conservation;Taping    PT Next Visit Plan  f/u on contact with Dr. Romeo Apple; f/u on how pt felt after taking a break  from HEP; continue focusing on ROM and trial initiation of functional strengthening as tolerated by pt    PT Home Exercise Plan  Eval: quad sets, weighted knee extension stretch, heel slides, knee flexion stretch, hamstring stretch     Consulted and Agree with Plan of Care  Patient       Patient will benefit from skilled therapeutic intervention in order to improve the following deficits and impairments:  Abnormal gait, Increased fascial restricitons, Pain, Improper body mechanics, Decreased coordination, Decreased mobility, Increased muscle spasms, Postural dysfunction, Decreased scar mobility, Decreased activity tolerance, Decreased range of motion, Decreased strength, Difficulty walking, Increased edema, Impaired flexibility  Visit Diagnosis: Lateral meniscus, posterior horn derangement, right - Plan: PT plan of care  cert/re-cert  S/P left knee arthroscopy - Plan: PT plan of care cert/re-cert  Stiffness of right knee, not elsewhere classified - Plan: PT plan of care cert/re-cert  Acute pain of right knee - Plan: PT plan of care cert/re-cert  Difficulty in walking, not elsewhere classified - Plan: PT plan of care cert/re-cert  Localized edema - Plan: PT plan of care cert/re-cert     Problem List Patient Active Problem List   Diagnosis Date Noted  . S/P left knee arthroscopy 05/26/17  08/08/2017  . Lateral meniscus, posterior horn derangement, right       Jac CanavanBrooke Powell PT, DPT  [facility] [address] [place], [place], [address] Phone: [phone]   Fax:  [phone]  Name: Emily Simmons MRN: [id number] Date of Birth: [birth year]

## 2017-08-26 ENCOUNTER — Encounter (HOSPITAL_COMMUNITY): Payer: Self-pay

## 2017-08-29 ENCOUNTER — Ambulatory Visit (HOSPITAL_COMMUNITY): Payer: BLUE CROSS/BLUE SHIELD

## 2017-08-29 ENCOUNTER — Encounter (HOSPITAL_COMMUNITY): Payer: Self-pay

## 2017-08-29 DIAGNOSIS — M25661 Stiffness of right knee, not elsewhere classified: Secondary | ICD-10-CM

## 2017-08-29 DIAGNOSIS — Z9889 Other specified postprocedural states: Secondary | ICD-10-CM

## 2017-08-29 DIAGNOSIS — M23351 Other meniscus derangements, posterior horn of lateral meniscus, right knee: Secondary | ICD-10-CM

## 2017-08-29 DIAGNOSIS — R262 Difficulty in walking, not elsewhere classified: Secondary | ICD-10-CM

## 2017-08-29 DIAGNOSIS — M25561 Pain in right knee: Secondary | ICD-10-CM

## 2017-08-29 DIAGNOSIS — R600 Localized edema: Secondary | ICD-10-CM

## 2017-08-29 NOTE — Patient Instructions (Addendum)
  Prone Quad Stretch  Lie down flat on your stomach. Wrap a strap (belt, towel, dog leash) around the top of one of your feet and pull the strap across your opposite shoulder so that your knee starts to curl up to your body. Pull until a stretch is felt across the front of your thigh.     Perform 1x/day, 3-5 stretches holding for 30-60 seconds

## 2017-08-29 NOTE — Therapy (Signed)
[facility] [address] [place], [place], [address] Phone: [phone]   Fax:  [phone]  Physical Therapy Treatment  Patient Details  Name: Emily Simmons MRN: [id number] Date of Birth: 1975-11-03 Referring Provider: Fuller Canada    Encounter Date: 08/29/2017  PT End of Session - 08/29/17 1113    Visit Number  12    Number of Visits  25    Date for PT Re-Evaluation  09/21/17 done 11/5     Authorization Type  [employer] Other (30 visit limit)    Authorization Time Period  07/25/17 to 08/25/17; NEW: 08/24/17 to 09/21/17    Authorization - Visit Number  12    Authorization - Number of Visits  30    PT Start Time  1115    PT Stop Time  1158    PT Time Calculation (min)  43 min    Activity Tolerance  Patient tolerated treatment well;Patient limited by pain    Behavior During Therapy  [facility] for tasks assessed/performed       Past Medical History:  Diagnosis Date  . Arthritis    Right knee  . Chronic back pain 2008  . Diabetes mellitus without complication (HCC)   . GERD (gastroesophageal reflux disease)     Past Surgical History:  Procedure Laterality Date  . CARPAL TUNNEL RELEASE Right   . KNEE SURGERY Right   . TUBAL LIGATION      There were no vitals filed for this visit.  Subjective Assessment - 08/29/17 1117    Subjective  Pt states that she bumped her R knee this weekend against her stove door which knocked her off balance, but she denied any falls. She reports doing a lot of walking yesterday while she was running errands and her knee was sore last night.     Pertinent History  chronic LBP, borderline DM, hx old R knee scope     How long can you sit comfortably?  2.5-3 hour; 60 minutes at eval    How long can you stand comfortably?  90 minutes; 10-15 minutes at eval    How long can you walk comfortably?  60 minutes; 10-15 minutes at eval     Patient Stated Goals  get back to normal, get back to PLOF, do a lot of walking      Currently in Pain?  No/denies             [facility] Adult PT Treatment/Exercise - 08/29/17 0001      Knee/Hip Exercises: Stretches   Quad Stretch  Right;3 reps;30 seconds    Quad Stretch Limitations  prone with Advertising account executive  Both;3 reps;30 seconds    Gastroc Stretch Limitations  slant board      Knee/Hip Exercises: Aerobic   Recumbent Bike  x3 mins at beginning of sessino for ROM, seat 10 (trialed on seat 7 but pt unable due to lack of ROM)      Knee/Hip Exercises: Standing   Lateral Step Up  Right;10 reps;Hand Hold: 0;Step Height: 4"    Forward Step Up  Right;10 reps;Hand Hold: 0;Step Height: 4"    Functional Squat  3 sets;10 reps    Functional Squat Limitations  cues for proper technique    Rocker Board  2 minutes R/L    SLS  3x10", RLE only    SLS with Vectors  2 sets x5RT on foam      Knee/Hip Exercises: Seated  Sit to Sand  without UE support;2 sets;10 reps 2" box under LLE      Knee/Hip Exercises: Supine   Short Arc Quad Sets  Strengthening;Right;2 sets;15 reps    Short Arc Quad Sets Limitations  8# 1st set with 5" hold, 12# 2nd set with 5" hold    Straight Leg Raises  Right;2 sets;10 reps    Straight Leg Raises Limitations  5" hold    Knee Extension  AROM    Knee Extension Limitations  13    Knee Flexion  AROM    Knee Flexion Limitations  100      Knee/Hip Exercises: Prone   Contract/Relax to Increase Flexion  5x 10x holds; LLE    Other Prone Exercises  Left TKE with 3lbs posterior knee for proprioception; 15 reps 3-5 second holds           PT Education - 08/29/17 1200    Education provided  Yes    Education Details  resume HEP, added prone quad stretch to HEP    Person(s) Educated  Patient    Methods  Explanation;Demonstration;Handout    Comprehension  Verbalized understanding;Returned demonstration       PT Short Term Goals - 08/24/17 1043      PT SHORT TERM GOAL #1   Title  Patient to demonstrate R knee extension ROM as being 10  degrees in order to improve gait mechanics and reduce compensation pattern     Baseline  11/7: 13 deg this date    Time  2    Period  Weeks    Status  On-going      PT SHORT TERM GOAL #2   Title  Patient to demonstrate R knee flexion ROM as being 110 degrees in order to improve gait mechanics and reduce compensation pattern     Baseline  11/7: 100    Time  2    Period  Weeks    Status  On-going      PT SHORT TERM GOAL #3   Title  Patient to demonstrate consistent heel toe gait pattern, improved TKE, equal stance/swing phases, and elimiination of antalgic pattern in order to improve efficiency and quality of gait     Baseline  11/7: continued deviations    Time  2    Period  Weeks    Status  On-going      PT SHORT TERM GOAL #4   Title  Patient to be compliant with correct performance of HEP, to be updated PRN     Time  1    Period  Weeks    Status  Achieved        PT Long Term Goals - 08/24/17 1043      PT LONG TERM GOAL #1   Title  Patient to demonstrate R knee ROM as being 0-120 degrees in order to improve gait pattern and efficacy of movement     Baseline  11/7: 13-100 degrees (16-96 on 11/5)    Time  4    Period  Weeks    Status  On-going      PT LONG TERM GOAL #2   Title  Patient to be able to reciprocally ascend/descend full flight of stairs with no railing, good eccentric control, in order to show improved home/community access     Baseline  11/7: ascends well but is not confident in descent reciprocal and demos min deviations with descent    Time  4    Period  Weeks    Status  On-going      PT LONG TERM GOAL #3   Title  Patient to be able to tolerate closed chain activity for at least 90 minutes with R knee pain no more than 2/10 in order to assist in return to PLOF     Baseline  11/7: hasn't tried, but on average, she is on her feet comfortably for 45 mins    Time  4    Period  Weeks    Status  On-going            Plan - 08/29/17 1201    Clinical  Impression Statement  Progressed pt to more functional strengthening this date. Pt tolerated well but did demo fatigue of RLE, but she denied any pain. Pt was educated that she may experience some increased muscle soreness following and she verbalized understanding. Pt's AROM 13-100 at EOS this date.    Rehab Potential  Fair    Clinical Impairments Affecting Rehab Potential  (+) motivated, good outcomes with PT in the past, no major significant PMH    PT Frequency  2x / week    PT Duration  4 weeks    PT Treatment/Interventions  ADLs/Self Care Home Management;Biofeedback;Cryotherapy;DME Instruction;Gait training;Stair training;Functional mobility training;Therapeutic activities;Therapeutic exercise;Balance training;Neuromuscular re-education;Patient/family education;Manual techniques;Scar mobilization;Passive range of motion;Dry needling;Energy conservation;Taping    PT Next Visit Plan  f/u on contact with Dr. Romeo AppleHarrison; Assess how pt responded to initiation of functional strengthening last session; continue focusing on ROM    PT Home Exercise Plan  Eval: quad sets, weighted knee extension stretch, heel slides, knee flexion stretch, hamstring stretch; 11/12: prone quad stretch    Consulted and Agree with Plan of Care  Patient       Patient will benefit from skilled therapeutic intervention in order to improve the following deficits and impairments:  Abnormal gait, Increased fascial restricitons, Pain, Improper body mechanics, Decreased coordination, Decreased mobility, Increased muscle spasms, Postural dysfunction, Decreased scar mobility, Decreased activity tolerance, Decreased range of motion, Decreased strength, Difficulty walking, Increased edema, Impaired flexibility  Visit Diagnosis: Lateral meniscus, posterior horn derangement, right  S/P left knee arthroscopy  Stiffness of right knee, not elsewhere classified  Acute pain of right knee  Difficulty in walking, not elsewhere  classified  Localized edema     Problem List Patient Active Problem List   Diagnosis Date Noted  . S/P left knee arthroscopy 05/26/17  08/08/2017  . Lateral meniscus, posterior horn derangement, right        Jac CanavanBrooke Powell PT, DPT  [facility] [address] [place], [place], [address] Phone: [phone]   Fax:  [phone]  Name: Emily Simmons MRN: [id number] Date of Birth: 31-Jul-1976

## 2017-08-31 ENCOUNTER — Ambulatory Visit (HOSPITAL_COMMUNITY): Payer: BLUE CROSS/BLUE SHIELD | Admitting: Physical Therapy

## 2017-08-31 DIAGNOSIS — R600 Localized edema: Secondary | ICD-10-CM

## 2017-08-31 DIAGNOSIS — M25661 Stiffness of right knee, not elsewhere classified: Secondary | ICD-10-CM | POA: Diagnosis not present

## 2017-08-31 DIAGNOSIS — R262 Difficulty in walking, not elsewhere classified: Secondary | ICD-10-CM

## 2017-08-31 DIAGNOSIS — Z9889 Other specified postprocedural states: Secondary | ICD-10-CM

## 2017-08-31 DIAGNOSIS — M23351 Other meniscus derangements, posterior horn of lateral meniscus, right knee: Secondary | ICD-10-CM

## 2017-08-31 DIAGNOSIS — M25561 Pain in right knee: Secondary | ICD-10-CM

## 2017-08-31 NOTE — Therapy (Signed)
[facility] [address] [place], [place], [address] Phone: [phone]   Fax:  [phone]  Physical Therapy Treatment  Patient Details  Name: Emily Simmons MRN: [id number] Date of Birth: 1976-04-29 Referring Provider: Fuller Canada    Encounter Date: 08/31/2017  PT End of Session - 08/31/17 1000    Visit Number  13    Number of Visits  25    Date for PT Re-Evaluation  09/21/17 done 11/5     Authorization Type  [employer] Other (30 visit limit)    Authorization Time Period  07/25/17 to 08/25/17; NEW: 08/24/17 to 09/21/17    Authorization - Visit Number  13    Authorization - Number of Visits  30    PT Start Time  0949    PT Stop Time  1040    PT Time Calculation (min)  51 min    Activity Tolerance  Patient tolerated treatment well;Patient limited by pain    Behavior During Therapy  [facility] for tasks assessed/performed       Past Medical History:  Diagnosis Date  . Arthritis    Right knee  . Chronic back pain 2008  . Diabetes mellitus without complication (HCC)   . GERD (gastroesophageal reflux disease)     Past Surgical History:  Procedure Laterality Date  . CARPAL TUNNEL RELEASE Right   . KNEE SURGERY Right   . TUBAL LIGATION      There were no vitals filed for this visit.  Subjective Assessment - 08/31/17 0949    Subjective  Pt states her pain returned yesterday in her Rt knee and now with pain of 7/10.  States she also has a "crick" in her neck.     Currently in Pain?  Yes    Pain Score  7     Pain Location  Knee    Pain Orientation  Right    Pain Descriptors / Indicators  Aching                      [facility] Adult PT Treatment/Exercise - 08/31/17 0001      Knee/Hip Exercises: Stretches   Active Hamstring Stretch  Right;3 reps;30 seconds    Active Hamstring Stretch Limitations  standing 12" box     Knee: Self-Stretch to increase Flexion  Right;10 seconds    Knee: Self-Stretch Limitations  10 reps on 12"  box to increase flexion    Gastroc Stretch  Both;3 reps;30 seconds    Gastroc Stretch Limitations  slant board      Knee/Hip Exercises: Aerobic   Recumbent Bike  x5 mins at beginning of session for ROM, seat 8      Knee/Hip Exercises: Standing   Lateral Step Up  Right;10 reps;Hand Hold: 0;Step Height: 4"    Forward Step Up  Right;10 reps;Hand Hold: 0;Step Height: 4"    Step Down  Right;Hand Hold: 1;Step Height: 4";10 reps      Knee/Hip Exercises: Seated   Sit to Sand  without UE support;2 sets;10 reps      Knee/Hip Exercises: Supine   Short Arc Quad Sets  Strengthening;Right;2 sets;15 reps    Short Arc Quad Sets Limitations  8#, 12# and one set with ER all with 5" holds    Straight Leg Raises  Right;2 sets;10 reps    Knee Extension  AROM    Knee Extension Limitations  10    Knee Flexion  AROM    Knee Flexion  Limitations  108      Knee/Hip Exercises: Prone   Contract/Relax to Increase Flexion  3x 10x holds; LLE    Other Prone Exercises  TKE (prone QS)      Manual Therapy   Manual Therapy  Soft tissue mobilization    Manual therapy comments  completed seperately from all other skilled interventions    Soft tissue mobilization  medial knee to improve mobiltiy and decrease pain lateral posterior knee in prone to decrease tightness               PT Short Term Goals - 08/24/17 1043      PT SHORT TERM GOAL #1   Title  Patient to demonstrate R knee extension ROM as being 10 degrees in order to improve gait mechanics and reduce compensation pattern     Baseline  11/7: 13 deg this date    Time  2    Period  Weeks    Status  On-going      PT SHORT TERM GOAL #2   Title  Patient to demonstrate R knee flexion ROM as being 110 degrees in order to improve gait mechanics and reduce compensation pattern     Baseline  11/7: 100    Time  2    Period  Weeks    Status  On-going      PT SHORT TERM GOAL #3   Title  Patient to demonstrate consistent heel toe gait pattern, improved  TKE, equal stance/swing phases, and elimiination of antalgic pattern in order to improve efficiency and quality of gait     Baseline  11/7: continued deviations    Time  2    Period  Weeks    Status  On-going      PT SHORT TERM GOAL #4   Title  Patient to be compliant with correct performance of HEP, to be updated PRN     Time  1    Period  Weeks    Status  Achieved        PT Long Term Goals - 08/24/17 1043      PT LONG TERM GOAL #1   Title  Patient to demonstrate R knee ROM as being 0-120 degrees in order to improve gait pattern and efficacy of movement     Baseline  11/7: 13-100 degrees (16-96 on 11/5)    Time  4    Period  Weeks    Status  On-going      PT LONG TERM GOAL #2   Title  Patient to be able to reciprocally ascend/descend full flight of stairs with no railing, good eccentric control, in order to show improved home/community access     Baseline  11/7: ascends well but is not confident in descent reciprocal and demos min deviations with descent    Time  4    Period  Weeks    Status  On-going      PT LONG TERM GOAL #3   Title  Patient to be able to tolerate closed chain activity for at least 90 minutes with R knee pain no more than 2/10 in order to assist in return to PLOF     Baseline  11/7: hasn't tried, but on average, she is on her feet comfortably for 45 mins    Time  4    Period  Weeks    Status  On-going            Plan - 08/31/17 1001  Clinical Impression Statement  Able to increase bike seat to 8 today indicating improving ROM.  Completed stretches and advanced additional exercises wtihout increased c/o pain today.   Pt still with difficulty contracting quad to complete quad set, however is able to complete SAQ.  Also had patient complete with ER as noted medial knee weakness, requiring therapist assist to decrease valgus with step activity. Added quad set in prone with good contraction noted, however still lacking extension and end range strength.   Continued with contract relax with good results.  AROM today 10-108    Rehab Potential  Fair    Clinical Impairments Affecting Rehab Potential  (+) motivated, good outcomes with PT in the past, no major significant PMH    PT Frequency  2x / week    PT Duration  4 weeks    PT Treatment/Interventions  ADLs/Self Care Home Management;Biofeedback;Cryotherapy;DME Instruction;Gait training;Stair training;Functional mobility training;Therapeutic activities;Therapeutic exercise;Balance training;Neuromuscular re-education;Patient/family education;Manual techniques;Scar mobilization;Passive range of motion;Dry needling;Energy conservation;Taping    PT Next Visit Plan  Continue with primary focus on ROM with continuation of functional strength.  Pt states she will need notes for work next Wednesday.     PT Home Exercise Plan  Eval: quad sets, weighted knee extension stretch, heel slides, knee flexion stretch, hamstring stretch; 11/12: prone quad stretch    Consulted and Agree with Plan of Care  Patient       Patient will benefit from skilled therapeutic intervention in order to improve the following deficits and impairments:  Abnormal gait, Increased fascial restricitons, Pain, Improper body mechanics, Decreased coordination, Decreased mobility, Increased muscle spasms, Postural dysfunction, Decreased scar mobility, Decreased activity tolerance, Decreased range of motion, Decreased strength, Difficulty walking, Increased edema, Impaired flexibility  Visit Diagnosis: Lateral meniscus, posterior horn derangement, right  S/P left knee arthroscopy  Stiffness of right knee, not elsewhere classified  Acute pain of right knee  Difficulty in walking, not elsewhere classified  Localized edema     Problem List Patient Active Problem List   Diagnosis Date Noted  . S/P left knee arthroscopy 05/26/17  08/08/2017  . Lateral meniscus, posterior horn derangement, right    Lurena Nidamy B Frazier,  PTA/CLT [phone]  Lurena NidaFrazier, Amy B 08/31/2017, 11:16 AM  [facility] [address] [place], [place], [address] Phone: [phone]   Fax:  [phone]  Name: Emily Simmons MRN: [id number] Date of Birth: October 15, 1976

## 2017-09-02 ENCOUNTER — Encounter (HOSPITAL_COMMUNITY): Payer: Self-pay

## 2017-09-05 ENCOUNTER — Encounter (HOSPITAL_COMMUNITY): Payer: Self-pay

## 2017-09-05 ENCOUNTER — Ambulatory Visit (HOSPITAL_COMMUNITY): Payer: BLUE CROSS/BLUE SHIELD

## 2017-09-05 DIAGNOSIS — M25661 Stiffness of right knee, not elsewhere classified: Secondary | ICD-10-CM

## 2017-09-05 DIAGNOSIS — R600 Localized edema: Secondary | ICD-10-CM

## 2017-09-05 DIAGNOSIS — M25561 Pain in right knee: Secondary | ICD-10-CM

## 2017-09-05 DIAGNOSIS — R262 Difficulty in walking, not elsewhere classified: Secondary | ICD-10-CM

## 2017-09-05 DIAGNOSIS — Z9889 Other specified postprocedural states: Secondary | ICD-10-CM

## 2017-09-05 DIAGNOSIS — M23351 Other meniscus derangements, posterior horn of lateral meniscus, right knee: Secondary | ICD-10-CM

## 2017-09-05 NOTE — Therapy (Signed)
[facility] [address] [place], [place], [address] Phone: [phone]   Fax:  [phone]  Physical Therapy Treatment  Patient Details  Name: Emily Simmons M Pisani MRN: [id number] Date of Birth: [birth year] Referring Provider: Fuller CanadaStanley Harrison    Encounter Date: 09/05/2017  PT End of Session - 09/05/17 0934    Visit Number  14    Number of Visits  25    Date for PT Re-Evaluation  09/21/17 done 11/5     Authorization Type  [employer] Other (30 visit limit)    Authorization Time Period  07/25/17 to 08/25/17; NEW: 08/24/17 to 09/21/17    Authorization - Visit Number  14    Authorization - Number of Visits  30    PT Start Time  0900    PT Stop Time  0945    PT Time Calculation (min)  45 min    Activity Tolerance  Patient tolerated treatment well;Patient limited by pain    Behavior During Therapy  [facility] for tasks assessed/performed       Past Medical History:  Diagnosis Date  . Arthritis    Right knee  . Chronic back pain 2008  . Diabetes mellitus without complication (HCC)   . GERD (gastroesophageal reflux disease)     Past Surgical History:  Procedure Laterality Date  . CARPAL TUNNEL RELEASE Right   . KNEE ARTHROSCOPY WITH LATERAL MENISECTOMY Right 05/26/2017   Performed by Vickki HearingHarrison, Stanley E, MD at [facility]  . KNEE SURGERY Right   . TUBAL LIGATION      There were no vitals filed for this visit.  Subjective Assessment - 09/05/17 0855    Subjective  Patient states that she had "an episode" yesterday when she first woke up her knee was "locked up". She recalls using crutches all day yesterday and had to sit in a chair while making breakfast. She notes that she unsure what movement caused it but her knee popped but it loosed up some. She took a hot shower then put biofreeze on and her knee brace, she recalls in the evening taking her brace off is when it popped. Today not ambulating with crutches, but present with significant decreased WB on Right LE  with moderate decreased knee flexion during swing phase.     Pain Score  7     Pain Location  Knee    Pain Orientation  Right    Pain Descriptors / Indicators  Aching;Dull                      [facility] Adult PT Treatment/Exercise - 09/05/17 0001      Knee/Hip Exercises: Stretches   Active Hamstring Stretch  Right;3 reps;30 seconds    Active Hamstring Stretch Limitations  standing 12" box     Knee: Self-Stretch to increase Flexion  Right;10 seconds    Knee: Self-Stretch Limitations  10 reps on 12" box to increase flexion    Gastroc Stretch  Both;3 reps;30 seconds    Gastroc Stretch Limitations  slant board      Knee/Hip Exercises: Aerobic   Recumbent Bike  x5 mins at beginning of session for ROM, seat 8      Knee/Hip Exercises: Supine   Short Arc [employer] Sets  Strengthening;Right;2 sets;15 reps    Short Arc Quad Sets Limitations  12#, one set ER    Straight Leg Raises  Right;2 sets;10 reps    Straight Leg Raises Limitations  5" hold  Knee Extension  AROM    Knee Extension Limitations  10    Knee Flexion  AROM    Knee Flexion Limitations  110      Manual Therapy   Manual Therapy  Soft tissue mobilization    Manual therapy comments  completed seperately from all other skilled interventions    Soft tissue mobilization  medial knee to improve mobiltiy and decrease pain lateral posterior knee in supine to decrease tightness               PT Short Term Goals - 08/24/17 1043      PT SHORT TERM GOAL #1   Title  Patient to demonstrate R knee extension ROM as being 10 degrees in order to improve gait mechanics and reduce compensation pattern     Baseline  11/7: 13 deg this date    Time  2    Period  Weeks    Status  On-going      PT SHORT TERM GOAL #2   Title  Patient to demonstrate R knee flexion ROM as being 110 degrees in order to improve gait mechanics and reduce compensation pattern     Baseline  11/7: 100    Time  2    Period  Weeks    Status  On-going       PT SHORT TERM GOAL #3   Title  Patient to demonstrate consistent heel toe gait pattern, improved TKE, equal stance/swing phases, and elimiination of antalgic pattern in order to improve efficiency and quality of gait     Baseline  11/7: continued deviations    Time  2    Period  Weeks    Status  On-going      PT SHORT TERM GOAL #4   Title  Patient to be compliant with correct performance of HEP, to be updated PRN     Time  1    Period  Weeks    Status  Achieved        PT Long Term Goals - 08/24/17 1043      PT LONG TERM GOAL #1   Title  Patient to demonstrate R knee ROM as being 0-120 degrees in order to improve gait pattern and efficacy of movement     Baseline  11/7: 13-100 degrees (16-96 on 11/5)    Time  4    Period  Weeks    Status  On-going      PT LONG TERM GOAL #2   Title  Patient to be able to reciprocally ascend/descend full flight of stairs with no railing, good eccentric control, in order to show improved home/community access     Baseline  11/7: ascends well but is not confident in descent reciprocal and demos min deviations with descent    Time  4    Period  Weeks    Status  On-going      PT LONG TERM GOAL #3   Title  Patient to be able to tolerate closed chain activity for at least 90 minutes with R knee pain no more than 2/10 in order to assist in return to PLOF     Baseline  11/7: hasn't tried, but on average, she is on her feet comfortably for 45 mins    Time  4    Period  Weeks    Status  On-going            Plan - 09/05/17 0941    Clinical Impression Statement  Today's session patient presented with significant increased pain with decreased functional mobility knee flexion during ambulation. Started session with 5 minutes on the bike (no charge) with continued focused on mobility and pain management initially. Pain during today's session medial right knee at joint line and slightly superior. Patient had significant tenderness medial along  inferior knee cap with increased tissue restriction superior medial right knee in distal musculature. Patient noted improvement in symptoms poster manual therapy and was able to complete supine quad strengthening exercises without increased pain. She was able to tolerate supine exercises. Spent more time on manual this session, plan to resume all exercises next session per patient presentation. Gave patient copy of last 2 visits (including today's session) for work purposes. End of session pain rating 6/10.     Rehab Potential  Fair    Clinical Impairments Affecting Rehab Potential  (+) motivated, good outcomes with PT in the past, no major significant PMH    PT Frequency  2x / week    PT Duration  4 weeks    PT Treatment/Interventions  ADLs/Self Care Home Management;Biofeedback;Cryotherapy;DME Instruction;Gait training;Stair training;Functional mobility training;Therapeutic activities;Therapeutic exercise;Balance training;Neuromuscular re-education;Patient/family education;Manual techniques;Scar mobilization;Passive range of motion;Dry needling;Energy conservation;Taping    PT Next Visit Plan  Continue with primary focus on ROM with continuation of functional strength.     PT Home Exercise Plan  Eval: quad sets, weighted knee extension stretch, heel slides, knee flexion stretch, hamstring stretch; 11/12: prone quad stretch    Consulted and Agree with Plan of Care  Patient       Patient will benefit from skilled therapeutic intervention in order to improve the following deficits and impairments:  Abnormal gait, Increased fascial restricitons, Pain, Improper body mechanics, Decreased coordination, Decreased mobility, Increased muscle spasms, Postural dysfunction, Decreased scar mobility, Decreased activity tolerance, Decreased range of motion, Decreased strength, Difficulty walking, Increased edema, Impaired flexibility  Visit Diagnosis: Lateral meniscus, posterior horn derangement, right  S/P left  knee arthroscopy  Stiffness of right knee, not elsewhere classified  Acute pain of right knee  Difficulty in walking, not elsewhere classified  Localized edema     Problem List Patient Active Problem List   Diagnosis Date Noted  . S/P left knee arthroscopy 05/26/17  08/08/2017  . Lateral meniscus, posterior horn derangement, right    Candise Che PT, DPT 9:44 AM, 09/05/17 [phone]  [facility] [address] [place], [place], [address] Phone: [phone]   Fax:  [phone]  Name: MUSHKA LACONTE MRN: [id number] Date of Birth: 07-24-1976

## 2017-09-07 ENCOUNTER — Ambulatory Visit (INDEPENDENT_AMBULATORY_CARE_PROVIDER_SITE_OTHER): Payer: BLUE CROSS/BLUE SHIELD | Admitting: Orthopedic Surgery

## 2017-09-07 ENCOUNTER — Encounter: Payer: Self-pay | Admitting: Orthopedic Surgery

## 2017-09-07 ENCOUNTER — Ambulatory Visit (HOSPITAL_COMMUNITY): Payer: BLUE CROSS/BLUE SHIELD | Admitting: Physical Therapy

## 2017-09-07 ENCOUNTER — Telehealth (HOSPITAL_COMMUNITY): Payer: Self-pay | Admitting: Physical Therapy

## 2017-09-07 VITALS — BP 135/90 | HR 77 | Ht 68.0 in | Wt 215.0 lb

## 2017-09-07 DIAGNOSIS — M25561 Pain in right knee: Secondary | ICD-10-CM

## 2017-09-07 DIAGNOSIS — Z9889 Other specified postprocedural states: Secondary | ICD-10-CM

## 2017-09-07 DIAGNOSIS — M23351 Other meniscus derangements, posterior horn of lateral meniscus, right knee: Secondary | ICD-10-CM

## 2017-09-07 DIAGNOSIS — M25661 Stiffness of right knee, not elsewhere classified: Secondary | ICD-10-CM

## 2017-09-07 DIAGNOSIS — G8929 Other chronic pain: Secondary | ICD-10-CM | POA: Diagnosis not present

## 2017-09-07 DIAGNOSIS — R262 Difficulty in walking, not elsewhere classified: Secondary | ICD-10-CM

## 2017-09-07 MED ORDER — HYDROCODONE-ACETAMINOPHEN 5-325 MG PO TABS: 1.0000 | ORAL_TABLET | Freq: Four times a day (QID) | ORAL | 0 refills | Status: DC | PRN

## 2017-09-07 NOTE — Telephone Encounter (Signed)
New order received for [employer].  All required paperwork and order faxed to [employer] representative.  Order given to secretary to be scanned into Epic. Lurena NidaAmy B Frazier, PTA/CLT [phone]

## 2017-09-07 NOTE — Therapy (Signed)
[facility] [address] [place], [place], [address] Phone: [phone]   Fax:  [phone]  Physical Therapy Treatment  Patient Details  Name: Emily Reasonya M Umphlett MRN: [id number] Date of Birth: 1976/03/04 Referring Provider: Fuller CanadaStanley Harrison    Encounter Date: 09/07/2017  PT End of Session - 09/07/17 1107    Visit Number  15    Number of Visits  25    Date for PT Re-Evaluation  09/21/17 done 11/5     Authorization Type  BCBS Other (30 visit limit)    Authorization Time Period  07/25/17 to 08/25/17; NEW: 08/24/17 to 09/21/17    Authorization - Visit Number  15    Authorization - Number of Visits  30    PT Start Time  1037    PT Stop Time  1115    PT Time Calculation (min)  38 min    Activity Tolerance  Patient tolerated treatment well;Patient limited by pain    Behavior During Therapy  [facility] for tasks assessed/performed       Past Medical History:  Diagnosis Date  . Arthritis    Right knee  . Chronic back pain 2008  . Diabetes mellitus without complication (HCC)   . GERD (gastroesophageal reflux disease)     Past Surgical History:  Procedure Laterality Date  . CARPAL TUNNEL RELEASE Right   . KNEE ARTHROSCOPY WITH LATERAL MENISECTOMY Right 05/26/2017   Procedure: KNEE ARTHROSCOPY WITH LATERAL MENISECTOMY;  Surgeon: Vickki HearingHarrison, Stanley E, MD;  Location: [facility];  Service: Orthopedics;  Laterality: Right;  . KNEE SURGERY Right   . TUBAL LIGATION      There were no vitals filed for this visit.  Subjective Assessment - 09/07/17 1110    Subjective  Pt reports no pain.  States no episodes of knee "locking up" since Sunday.  Returned to MD today and comes back today with order for JAS brace. STates she has no pain just stiffness.    Currently in Pain?  No/denies                      [facility]Ex[facility] - 09/07/17 0001      Knee/Hip Exercises: Stretches   Active Hamstring Stretch  Right;3 reps;30 seconds     Active Hamstring Stretch Limitations  standing 12" box     Knee: Self-Stretch to increase Flexion  Right;10 seconds    Knee: Self-Stretch Limitations  10 reps on 12" box to increase flexion    Gastroc Stretch  Both;3 reps;30 seconds    Gastroc Stretch Limitations  slant board      Knee/Hip Exercises: Supine   Short Arc Quad Sets  Strengthening;Right;2 sets;15 reps    Short Arc Quad Sets Limitations  10# one set neutral, one set ER    Straight Leg Raises  Right;2 sets;10 reps    Straight Leg Raises Limitations  5" hold    Knee Extension  AROM    Knee Extension Limitations  10    Knee Flexion  AROM    Knee Flexion Limitations  110      Manual Therapy   Manual Therapy  Soft tissue mobilization;Passive ROM    Manual therapy comments  completed seperately from all other skilled interventions    Soft tissue mobilization  medial knee to improve mobiltiy and decrease pain lateral posterior knee in supine to decrease tightness    Passive ROM  knee extension  PT Education - 09/07/17 1104    Education provided  Yes    Education Details  purpose of JAS brace and expectations    Person(s) Educated  Patient    Methods  Explanation;Handout    Comprehension  Verbalized understanding       PT Short Term Goals - 08/24/17 1043      PT SHORT TERM GOAL #1   Title  Patient to demonstrate R knee extension ROM as being 10 degrees in order to improve gait mechanics and reduce compensation pattern     Baseline  11/7: 13 deg this date    Time  2    Period  Weeks    Status  On-going      PT SHORT TERM GOAL #2   Title  Patient to demonstrate R knee flexion ROM as being 110 degrees in order to improve gait mechanics and reduce compensation pattern     Baseline  11/7: 100    Time  2    Period  Weeks    Status  On-going      PT SHORT TERM GOAL #3   Title  Patient to demonstrate consistent heel toe gait pattern, improved TKE, equal stance/swing phases, and elimiination of antalgic  pattern in order to improve efficiency and quality of gait     Baseline  11/7: continued deviations    Time  2    Period  Weeks    Status  On-going      PT SHORT TERM GOAL #4   Title  Patient to be compliant with correct performance of HEP, to be updated PRN     Time  1    Period  Weeks    Status  Achieved        PT Long Term Goals - 08/24/17 1043      PT LONG TERM GOAL #1   Title  Patient to demonstrate R knee ROM as being 0-120 degrees in order to improve gait pattern and efficacy of movement     Baseline  11/7: 13-100 degrees (16-96 on 11/5)    Time  4    Period  Weeks    Status  On-going      PT LONG TERM GOAL #2   Title  Patient to be able to reciprocally ascend/descend full flight of stairs with no railing, good eccentric control, in order to show improved home/community access     Baseline  11/7: ascends well but is not confident in descent reciprocal and demos min deviations with descent    Time  4    Period  Weeks    Status  On-going      PT LONG TERM GOAL #3   Title  Patient to be able to tolerate closed chain activity for at least 90 minutes with R knee pain no more than 2/10 in order to assist in return to PLOF     Baseline  11/7: hasn't tried, but on average, she is on her feet comfortably for 45 mins    Time  4    Period  Weeks    Status  On-going            Plan - 09/07/17 1107    Clinical Impression Statement  Educated patient and measured for JAS brace and faxed all required paperwork to JAS rep.    Continued with focus on ROM, primarily extension.  pt able to complete all exercises without c/o pain, just overall stiffness.  Continued with manual  to reduce adhesions and tightness.  Most sensitivity medial knee that subsided after completing manual.  Remained painfree at [facility].    Rehab Potential  Fair    Clinical Impairments Affecting Rehab Potential  (+) motivated, good outcomes with PT in the past, no major significant PMH    PT Frequency  2x / week     PT Duration  4 weeks    PT Treatment/Interventions  ADLs/Self Care Home Management;Biofeedback;Cryotherapy;DME Instruction;Gait training;Stair training;Functional mobility training;Therapeutic activities;Therapeutic exercise;Balance training;Neuromuscular re-education;Patient/family education;Manual techniques;Scar mobilization;Passive range of motion;Dry needling;Energy conservation;Taping    PT Next Visit Plan  Continue with primary focus on ROM with continuation of functional strength.   Instructed to notify therapist if she has not heard from JAS rep by November 28.    PT Home Exercise Plan  Eval: quad sets, weighted knee extension stretch, heel slides, knee flexion stretch, hamstring stretch; 11/12: prone quad stretch    Consulted and Agree with Plan of Care  Patient       Patient will benefit from skilled therapeutic intervention in order to improve the following deficits and impairments:  Abnormal gait, Increased fascial restricitons, Pain, Improper body mechanics, Decreased coordination, Decreased mobility, Increased muscle spasms, Postural dysfunction, Decreased scar mobility, Decreased activity tolerance, Decreased range of motion, Decreased strength, Difficulty walking, Increased edema, Impaired flexibility  Visit Diagnosis: Lateral meniscus, posterior horn derangement, right  S/P left knee arthroscopy  Stiffness of right knee, not elsewhere classified  Acute pain of right knee  Difficulty in walking, not elsewhere classified     Problem List Patient Active Problem List   Diagnosis Date Noted  . S/P left knee arthroscopy 05/26/17  08/08/2017  . Lateral meniscus, posterior horn derangement, right    Lurena Nidamy B Halia Franey, PTA/CLT [phone]  Lurena NidaFrazier, Daphnie Venturini B 09/07/2017, 11:16 AM  [facility] [address] [place], [place], [address] Phone: [phone]   Fax:  [phone]  Name: Emily Simmons MRN: [id number] Date of Birth:  May 22, 1976

## 2017-09-07 NOTE — Patient Instructions (Addendum)
Take ibuprofen for pain 1st then use hydrocodone if pain not controlled  Continue therapy   Steps to Quit Smoking Smoking tobacco can be bad for your health. It can also affect almost every organ in your body. Smoking puts you and people around you at risk for many serious long-lasting (chronic) diseases. Quitting smoking is hard, but it is one of the best things that you can do for your health. It is never too late to quit. What are the benefits of quitting smoking? When you quit smoking, you lower your risk for getting serious diseases and conditions. They can include:  Lung cancer or lung disease.  Heart disease.  Stroke.  Heart attack.  Not being able to have children (infertility).  Weak bones (osteoporosis) and broken bones (fractures).  If you have coughing, wheezing, and shortness of breath, those symptoms may get better when you quit. You may also get sick less often. If you are pregnant, quitting smoking can help to lower your chances of having a baby of low birth weight. What can I do to help me quit smoking? Talk with your doctor about what can help you quit smoking. Some things you can do (strategies) include:  Quitting smoking totally, instead of slowly cutting back how much you smoke over a period of time.  Going to in-person counseling. You are more likely to quit if you go to many counseling sessions.  Using resources and support systems, such as: ? Agricultural engineernline chats with a Veterinary surgeoncounselor. ? Phone quitlines. ? Automotive engineerrinted self-help materials. ? Support groups or group counseling. ? Text messaging programs. ? Mobile phone apps or applications.  Taking medicines. Some of these medicines may have nicotine in them. If you are pregnant or breastfeeding, do not take any medicines to quit smoking unless your doctor says it is okay. Talk with your doctor about counseling or other things that can help you.  Talk with your doctor about using more than one strategy at the same time,  such as taking medicines while you are also going to in-person counseling. This can help make quitting easier. What things can I do to make it easier to quit? Quitting smoking might feel very hard at first, but there is a lot that you can do to make it easier. Take these steps:  Talk to your family and friends. Ask them to support and encourage you.  Call phone quitlines, reach out to support groups, or work with a Veterinary surgeoncounselor.  Ask people who smoke to not smoke around you.  Avoid places that make you want (trigger) to smoke, such as: ? Bars. ? Parties. ? Smoke-break areas at work.  Spend time with people who do not smoke.  Lower the stress in your life. Stress can make you want to smoke. Try these things to help your stress: ? Getting regular exercise. ? Deep-breathing exercises. ? Yoga. ? Meditating. ? Doing a body scan. To do this, close your eyes, focus on one area of your body at a time from head to toe, and notice which parts of your body are tense. Try to relax the muscles in those areas.  Download or buy apps on your mobile phone or tablet that can help you stick to your quit plan. There are many free apps, such as QuitGuide from the [employer] developer(Centers for Disease Control and Prevention). You can find more support from smokefree.gov and other websites.  This information is not intended to replace advice given to you by your health care provider.  Make sure you discuss any questions you have with your health care provider. Document Released: 07/31/2009 Document Revised: 06/01/2016 Document Reviewed: 02/18/2015 Elsevier Interactive Patient Education  2018 [employer]

## 2017-09-07 NOTE — Progress Notes (Signed)
F/u   C/o pain and stiffness  Ros no fever   BP 135/90   Pulse 77   Ht 5\' 8"  (1.727 m)   Wt 215 lb (97.5 kg)   BMI 32.69 kg/m  Physical Exam  Constitutional: She appears well-developed and well-nourished.  Neurological: Gait normal.  Skin: Skin is warm and dry.  Psychiatric: She has a normal mood and affect. Her behavior is normal. Thought content normal.    Flexion is normal but she has 10 degree short extension  No swelling  Medial tenderness left joint line   Encounter Diagnoses  Name Primary?  . S/P left knee arthroscopy 05/26/17    . Chronic pain of right knee Yes  . Stiffness of right knee    REC JAZ SPLINT    Meds ordered this encounter  Medications  . HYDROcodone-acetaminophen (NORCO/VICODIN) 5-325 MG tablet    Sig: Take 1 tablet by mouth every 6 (six) hours as needed for moderate pain.    Dispense:  30 tablet    Refill:  0   REC IBUPROFEN AS FIRST LINE PAIN RELIEF   RETURN IN 4 WEEKS

## 2017-09-12 ENCOUNTER — Encounter (HOSPITAL_COMMUNITY): Payer: Self-pay

## 2017-09-12 ENCOUNTER — Ambulatory Visit (HOSPITAL_COMMUNITY): Payer: BLUE CROSS/BLUE SHIELD

## 2017-09-12 DIAGNOSIS — M25661 Stiffness of right knee, not elsewhere classified: Secondary | ICD-10-CM

## 2017-09-12 DIAGNOSIS — M23351 Other meniscus derangements, posterior horn of lateral meniscus, right knee: Secondary | ICD-10-CM

## 2017-09-12 DIAGNOSIS — Z9889 Other specified postprocedural states: Secondary | ICD-10-CM

## 2017-09-12 DIAGNOSIS — M25561 Pain in right knee: Secondary | ICD-10-CM

## 2017-09-12 DIAGNOSIS — R600 Localized edema: Secondary | ICD-10-CM

## 2017-09-12 DIAGNOSIS — R262 Difficulty in walking, not elsewhere classified: Secondary | ICD-10-CM

## 2017-09-12 NOTE — Patient Instructions (Signed)
   Prone Knee Hang  Laying on stomach, place a rolled up towel underneath thigh just above the knee cap. Slide body down to the edge of table until knee cap is hanging off.   Roll up a towel or 2 together and lay there for 5-10 mins, 1-2x/day

## 2017-09-12 NOTE — Therapy (Signed)
[facility] [address] [place], [place], [address] Phone: [phone]   Fax:  [phone]  Physical Therapy Treatment  Patient Details  Name: Emily Simmons MRN: [id number] Date of Birth: 03/01/1976 Referring Provider: Fuller CanadaStanley Harrison    Encounter Date: 09/12/2017  PT End of Session - 09/12/17 0952    Visit Number  16    Number of Visits  25    Date for PT Re-Evaluation  09/21/17 done 11/5     Authorization Type  BCBS Other (30 visit limit)    Authorization Time Period  07/25/17 to 08/25/17; NEW: 08/24/17 to 09/21/17    Authorization - Visit Number  16    Authorization - Number of Visits  30    PT Start Time  0946    PT Stop Time  1027    PT Time Calculation (min)  41 min    Activity Tolerance  Patient tolerated treatment well;Patient limited by pain    Behavior During Therapy  [facility] for tasks assessed/performed       Past Medical History:  Diagnosis Date  . Arthritis    Right knee  . Chronic back pain 2008  . Diabetes mellitus without complication (HCC)   . GERD (gastroesophageal reflux disease)     Past Surgical History:  Procedure Laterality Date  . CARPAL TUNNEL RELEASE Right   . KNEE ARTHROSCOPY WITH LATERAL MENISECTOMY Right 05/26/2017   Procedure: KNEE ARTHROSCOPY WITH LATERAL MENISECTOMY;  Surgeon: Vickki HearingHarrison, Stanley E, MD;  Location: [facility];  Service: Orthopedics;  Laterality: Right;  . KNEE SURGERY Right   . TUBAL LIGATION      There were no vitals filed for this visit.  Subjective Assessment - 09/12/17 0949    Subjective  Pt states that her R knee locked up on her again over the weekend. She states she had to walk with crutches yesterday due to pain. She does not have any pain today. She is not using any AD currently.     Currently in Pain?  No/denies           [facility] Adult PT Treatment/Exercise - 09/12/17 0001      Knee/Hip Exercises: Stretches   Active Hamstring Stretch  Right;3 reps;30 seconds    Active  Hamstring Stretch Limitations  standing 12" box     Knee: Self-Stretch to increase Flexion  Right;10 seconds    Knee: Self-Stretch Limitations  10 reps on 12" box to increase flexion      Knee/Hip Exercises: Aerobic   Recumbent Bike  x5 mins at beginning of session for ROM, seat 8      Knee/Hip Exercises: Standing   Rocker Board  2 minutes R/L    Gait Training  resisted retro gait with purple band  4420ft x2RT    Other Standing Knee Exercises  heel taps on 4" step and TKE at top with BTB 2x10 reps      Knee/Hip Exercises: Supine   Knee Extension  AROM    Knee Extension Limitations  10    Knee Flexion  AROM    Knee Flexion Limitations  100            PT Education - 09/12/17 0952    Education provided  Yes    Education Details  added prone knee hang to HEP    Person(s) Educated  Patient    Methods  Explanation;Demonstration;Handout    Comprehension  Verbalized understanding;Returned demonstration       PT  Short Term Goals - 08/24/17 1043      PT SHORT TERM GOAL #1   Title  Patient to demonstrate R knee extension ROM as being 10 degrees in order to improve gait mechanics and reduce compensation pattern     Baseline  11/7: 13 deg this date    Time  2    Period  Weeks    Status  On-going      PT SHORT TERM GOAL #2   Title  Patient to demonstrate R knee flexion ROM as being 110 degrees in order to improve gait mechanics and reduce compensation pattern     Baseline  11/7: 100    Time  2    Period  Weeks    Status  On-going      PT SHORT TERM GOAL #3   Title  Patient to demonstrate consistent heel toe gait pattern, improved TKE, equal stance/swing phases, and elimiination of antalgic pattern in order to improve efficiency and quality of gait     Baseline  11/7: continued deviations    Time  2    Period  Weeks    Status  On-going      PT SHORT TERM GOAL #4   Title  Patient to be compliant with correct performance of HEP, to be updated PRN     Time  1    Period  Weeks     Status  Achieved        PT Long Term Goals - 08/24/17 1043      PT LONG TERM GOAL #1   Title  Patient to demonstrate R knee ROM as being 0-120 degrees in order to improve gait pattern and efficacy of movement     Baseline  11/7: 13-100 degrees (16-96 on 11/5)    Time  4    Period  Weeks    Status  On-going      PT LONG TERM GOAL #2   Title  Patient to be able to reciprocally ascend/descend full flight of stairs with no railing, good eccentric control, in order to show improved home/community access     Baseline  11/7: ascends well but is not confident in descent reciprocal and demos min deviations with descent    Time  4    Period  Weeks    Status  On-going      PT LONG TERM GOAL #3   Title  Patient to be able to tolerate closed chain activity for at least 90 minutes with R knee pain no more than 2/10 in order to assist in return to PLOF     Baseline  11/7: hasn't tried, but on average, she is on her feet comfortably for 45 mins    Time  4    Period  Weeks    Status  On-going            Plan - 09/12/17 1030    Clinical Impression Statement  Pt presents to therapy with reports of another locking up episode over the holiday weekend. She states that her medial R knee pain was so bad yesterday that she had to ambulate with crutches. She states that she woke up this morning with no knee pain. Continued with some functional strengthening with fatigue noted. Ended with manual to address soft tissue restrictions and for pain management. Pt very tender at distal medial joint line on R knee. AROM limited this date due to stiffness as she was 10-100 deg this date. Continue as  planned, progressing as tolerated.     Rehab Potential  Fair    Clinical Impairments Affecting Rehab Potential  (+) motivated, good outcomes with PT in the past, no major significant PMH    PT Frequency  2x / week    PT Duration  4 weeks    PT Treatment/Interventions  ADLs/Self Care Home  Management;Biofeedback;Cryotherapy;DME Instruction;Gait training;Stair training;Functional mobility training;Therapeutic activities;Therapeutic exercise;Balance training;Neuromuscular re-education;Patient/family education;Manual techniques;Scar mobilization;Passive range of motion;Dry needling;Energy conservation;Taping    PT Next Visit Plan  Continue with primary focus on ROM with continuation of functional strength.  Instructed to notify therapist if she has not heard from JAS rep by November 28.     PT Home Exercise Plan  Eval: quad sets, weighted knee extension stretch, heel slides, knee flexion stretch, hamstring stretch; 11/12: prone quad stretch; 11/26: prone knee hang    Consulted and Agree with Plan of Care  Patient       Patient will benefit from skilled therapeutic intervention in order to improve the following deficits and impairments:  Abnormal gait, Increased fascial restricitons, Pain, Improper body mechanics, Decreased coordination, Decreased mobility, Increased muscle spasms, Postural dysfunction, Decreased scar mobility, Decreased activity tolerance, Decreased range of motion, Decreased strength, Difficulty walking, Increased edema, Impaired flexibility  Visit Diagnosis: Lateral meniscus, posterior horn derangement, right  S/P left knee arthroscopy  Stiffness of right knee, not elsewhere classified  Acute pain of right knee  Difficulty in walking, not elsewhere classified  Localized edema     Problem List Patient Active Problem List   Diagnosis Date Noted  . S/P left knee arthroscopy 05/26/17  08/08/2017  . Lateral meniscus, posterior horn derangement, right       Jac Canavan PT, DPT  [facility] [address] [place], [place], [address] Phone: [phone]   Fax:  [phone]  Name: Emily Simmons MRN: [id number] Date of Birth: [birth year]

## 2017-09-14 ENCOUNTER — Ambulatory Visit (HOSPITAL_COMMUNITY): Payer: BLUE CROSS/BLUE SHIELD | Admitting: Physical Therapy

## 2017-09-14 DIAGNOSIS — M25561 Pain in right knee: Secondary | ICD-10-CM

## 2017-09-14 DIAGNOSIS — M25661 Stiffness of right knee, not elsewhere classified: Secondary | ICD-10-CM | POA: Diagnosis not present

## 2017-09-14 DIAGNOSIS — R600 Localized edema: Secondary | ICD-10-CM

## 2017-09-14 DIAGNOSIS — Z9889 Other specified postprocedural states: Secondary | ICD-10-CM

## 2017-09-14 DIAGNOSIS — M23351 Other meniscus derangements, posterior horn of lateral meniscus, right knee: Secondary | ICD-10-CM

## 2017-09-14 DIAGNOSIS — R262 Difficulty in walking, not elsewhere classified: Secondary | ICD-10-CM

## 2017-09-14 NOTE — Therapy (Signed)
[facility] [address] [place], [place], [address] Phone: [phone]   Fax:  [phone]  Physical Therapy Treatment  Patient Details  Name: Emily Simmons M Stuck MRN: [id number] Date of Birth: [birth year] Referring Provider: Fuller CanadaStanley Harrison    Encounter Date: 09/14/2017  PT End of Session - 09/14/17 1138    Visit Number  17    Number of Visits  25    Date for PT Re-Evaluation  09/21/17 done 11/5     Authorization Type  [employer] Other (30 visit limit)    Authorization Time Period  07/25/17 to 08/25/17; NEW: 08/24/17 to 09/21/17    Authorization - Visit Number  17    Authorization - Number of Visits  30    PT Start Time  0945    PT Stop Time  1030    PT Time Calculation (min)  45 min    Activity Tolerance  Patient tolerated treatment well;Patient limited by pain    Behavior During Therapy  [facility] for tasks assessed/performed       Past Medical History:  Diagnosis Date  . Arthritis    Right knee  . Chronic back pain 2008  . Diabetes mellitus without complication (HCC)   . GERD (gastroesophageal reflux disease)     Past Surgical History:  Procedure Laterality Date  . CARPAL TUNNEL RELEASE Right   . KNEE ARTHROSCOPY WITH LATERAL MENISECTOMY Right 05/26/2017   Procedure: KNEE ARTHROSCOPY WITH LATERAL MENISECTOMY;  Surgeon: Vickki HearingHarrison, Stanley E, MD;  Location: [facility];  Service: Orthopedics;  Laterality: Right;  . KNEE SURGERY Right   . TUBAL LIGATION      There were no vitals filed for this visit.  Subjective Assessment - 09/14/17 1150    Subjective  Pt sates she has not had any other episodes of her knee locking.  states her hip and into her hamstring continues to hurt at 8/10 and is most limiting.  Knee pain is 1/10 today.    Currently in Pain?  Yes    Pain Score  1     Pain Location  Knee    Pain Orientation  Right;Medial    Pain Descriptors / Indicators  Aching;Sore    Pain Type  Chronic pain                      [facility]  Adult PT Treatment/Exercise - 09/14/17 0001      Knee/Hip Exercises: Stretches   Active Hamstring Stretch  Right;3 reps;30 seconds    Active Hamstring Stretch Limitations  standing 12" box     Knee: Self-Stretch to increase Flexion  Right;10 seconds    Knee: Self-Stretch Limitations  10 reps on 12" box to increase flexion      Knee/Hip Exercises: Aerobic   Recumbent Bike  x5 mins at beginning of session for ROM, seat 8      Knee/Hip Exercises: Standing   Lateral Step Up  Right;10 reps;Hand Hold: 0;Step Height: 4"    Forward Step Up  Right;10 reps;Hand Hold: 0;Step Height: 4"    Step Down  Right;Hand Hold: 1;Step Height: 4";10 reps      Knee/Hip Exercises: Supine   Knee Extension Limitations  8    Knee Flexion Limitations  115      Manual Therapy   Manual Therapy  Soft tissue mobilization;Passive ROM    Manual therapy comments  completed seperately from all other skilled interventions    Soft tissue mobilization  medial knee  to improve mobiltiy and decrease pain lateral posterior knee in supine to decrease tightness    Passive ROM  knee extension               PT Short Term Goals - 08/24/17 1043      PT SHORT TERM GOAL #1   Title  Patient to demonstrate R knee extension ROM as being 10 degrees in order to improve gait mechanics and reduce compensation pattern     Baseline  11/7: 13 deg this date    Time  2    Period  Weeks    Status  On-going      PT SHORT TERM GOAL #2   Title  Patient to demonstrate R knee flexion ROM as being 110 degrees in order to improve gait mechanics and reduce compensation pattern     Baseline  11/7: 100    Time  2    Period  Weeks    Status  On-going      PT SHORT TERM GOAL #3   Title  Patient to demonstrate consistent heel toe gait pattern, improved TKE, equal stance/swing phases, and elimiination of antalgic pattern in order to improve efficiency and quality of gait     Baseline  11/7: continued deviations    Time  2    Period  Weeks     Status  On-going      PT SHORT TERM GOAL #4   Title  Patient to be compliant with correct performance of HEP, to be updated PRN     Time  1    Period  Weeks    Status  Achieved        PT Long Term Goals - 08/24/17 1043      PT LONG TERM GOAL #1   Title  Patient to demonstrate R knee ROM as being 0-120 degrees in order to improve gait pattern and efficacy of movement     Baseline  11/7: 13-100 degrees (16-96 on 11/5)    Time  4    Period  Weeks    Status  On-going      PT LONG TERM GOAL #2   Title  Patient to be able to reciprocally ascend/descend full flight of stairs with no railing, good eccentric control, in order to show improved home/community access     Baseline  11/7: ascends well but is not confident in descent reciprocal and demos min deviations with descent    Time  4    Period  Weeks    Status  On-going      PT LONG TERM GOAL #3   Title  Patient to be able to tolerate closed chain activity for at least 90 minutes with R knee pain no more than 2/10 in order to assist in return to PLOF     Baseline  11/7: hasn't tried, but on average, she is on her feet comfortably for 45 mins    Time  4    Period  Weeks    Status  On-going            Plan - 09/14/17 1139    Clinical Impression Statement  Pt without any episodes of locking up since the weekend, however she is describing some rt sided sciatic pain that is affecting her ability to mobilize as she should.  Only knee discomfort she is having is medially, given at 1/10.  Able to achieve greater ROM this session in Rt knee at 8-115 degrees.  Continued with manual to decrease pain prior to genttle PROM for extension.  Pt reported overall reduced pain at EOS.  Contacted [facility] who states they will rush the brace and resent all information.    Rehab Potential  Fair    Clinical Impairments Affecting Rehab Potential  (+) motivated, good outcomes with PT in the past, no major significant PMH    PT Frequency  2x / week     PT Duration  4 weeks    PT Treatment/Interventions  ADLs/Self Care Home Management;Biofeedback;Cryotherapy;DME Instruction;Gait training;Stair training;Functional mobility training;Therapeutic activities;Therapeutic exercise;Balance training;Neuromuscular re-education;Patient/family education;Manual techniques;Scar mobilization;Passive range of motion;Dry needling;Energy conservation;Taping    PT Next Visit Plan  Continue with primary focus on ROM with continuation of functional strength.  Follow up with [facility].     PT Home Exercise Plan  Eval: quad sets, weighted knee extension stretch, heel slides, knee flexion stretch, hamstring stretch; 11/12: prone quad stretch; 11/26: prone knee hang    Consulted and Agree with Plan of Care  Patient       Patient will benefit from skilled therapeutic intervention in order to improve the following deficits and impairments:  Abnormal gait, Increased fascial restricitons, Pain, Improper body mechanics, Decreased coordination, Decreased mobility, Increased muscle spasms, Postural dysfunction, Decreased scar mobility, Decreased activity tolerance, Decreased range of motion, Decreased strength, Difficulty walking, Increased edema, Impaired flexibility  Visit Diagnosis: Lateral meniscus, posterior horn derangement, right  S/P left knee arthroscopy  Stiffness of right knee, not elsewhere classified  Acute pain of right knee  Difficulty in walking, not elsewhere classified  Localized edema     Problem List Patient Active Problem List   Diagnosis Date Noted  . S/P left knee arthroscopy 05/26/17  08/08/2017  . Lateral meniscus, posterior horn derangement, right    Lurena Nida, PTA/CLT [phone]  Lurena Nida 09/14/2017, 11:51 AM  [facility] [address] [place], [place], [address] Phone: [phone]   Fax:  [phone]  Name: EARLYNE FEESER MRN: [id number] Date of Birth: 1976/08/27

## 2017-09-16 ENCOUNTER — Telehealth: Payer: Self-pay | Admitting: Radiology

## 2017-09-16 DIAGNOSIS — G8929 Other chronic pain: Secondary | ICD-10-CM

## 2017-09-16 DIAGNOSIS — M25561 Pain in right knee: Principal | ICD-10-CM

## 2017-09-16 NOTE — Telephone Encounter (Signed)
Emily Simmons sent me email, needs new order for brace. It can't say JAS knee Extension orthosis it needs to say just knee Extension orthosis.

## 2017-09-19 ENCOUNTER — Other Ambulatory Visit: Payer: Self-pay

## 2017-09-19 ENCOUNTER — Encounter (HOSPITAL_COMMUNITY): Payer: Self-pay

## 2017-09-19 ENCOUNTER — Ambulatory Visit (HOSPITAL_COMMUNITY): Payer: BLUE CROSS/BLUE SHIELD | Attending: Orthopedic Surgery

## 2017-09-19 DIAGNOSIS — R262 Difficulty in walking, not elsewhere classified: Secondary | ICD-10-CM

## 2017-09-19 DIAGNOSIS — M25661 Stiffness of right knee, not elsewhere classified: Secondary | ICD-10-CM | POA: Diagnosis present

## 2017-09-19 DIAGNOSIS — M25561 Pain in right knee: Secondary | ICD-10-CM

## 2017-09-19 DIAGNOSIS — M23351 Other meniscus derangements, posterior horn of lateral meniscus, right knee: Secondary | ICD-10-CM

## 2017-09-19 DIAGNOSIS — Z9889 Other specified postprocedural states: Secondary | ICD-10-CM | POA: Diagnosis present

## 2017-09-19 DIAGNOSIS — R600 Localized edema: Secondary | ICD-10-CM

## 2017-09-19 NOTE — Therapy (Signed)
[facility] [address] [place], [place], [address] Phone: [phone]   Fax:  [phone]  Physical Therapy Treatment  Patient Details  Name: Emily Simmons MRN: [id number] Date of Birth: 08-07-1976 Referring Provider: Fuller Canada    Encounter Date: 09/19/2017  PT End of Session - 09/19/17 0951    Visit Number  18    Number of Visits  25    Date for PT Re-Evaluation  09/21/17 done 11/5     Authorization Type  [employer] Other (30 visit limit)    Authorization Time Period  07/25/17 to 08/25/17; NEW: 08/24/17 to 09/21/17    Authorization - Visit Number  18    Authorization - Number of Visits  30    PT Start Time  0948    PT Stop Time  1030    PT Time Calculation (min)  42 min    Activity Tolerance  Patient tolerated treatment well;Patient limited by pain    Behavior During Therapy  [facility] for tasks assessed/performed       Past Medical History:  Diagnosis Date  . Arthritis    Right knee  . Chronic back pain 2008  . Diabetes mellitus without complication (HCC)   . GERD (gastroesophageal reflux disease)     Past Surgical History:  Procedure Laterality Date  . CARPAL TUNNEL RELEASE Right   . KNEE ARTHROSCOPY WITH LATERAL MENISECTOMY Right 05/26/2017   Procedure: KNEE ARTHROSCOPY WITH LATERAL MENISECTOMY;  Surgeon: Vickki Hearing, MD;  Location: [facility];  Service: Orthopedics;  Laterality: Right;  . KNEE SURGERY Right   . TUBAL LIGATION      There were no vitals filed for this visit.  Subjective Assessment - 09/19/17 0950    Subjective  Pt states that her back is feeling better compared to Wednesday. She states that her R knee is feeling good today, no reports of it locking up on her over the weekend.     Currently in Pain?  No/denies         [facility] Adult PT Treatment/Exercise - 09/19/17 0001      Knee/Hip Exercises: Aerobic   Recumbent Bike  x5 mins at beginning of session for ROM, seat 7      Knee/Hip Exercises: Machines  for Strengthening   Cybex Knee Extension  RLE only, 2 plates, O96 reps    Cybex Knee Flexion  RLE only, 2 plates + extra weight, x15 reps    Total Gym Leg Press  RLE only, 25deg, x15 reps, cues for TKE at top      Knee/Hip Exercises: Standing   Hip Abduction  Both;2 sets;15 reps    Abduction Limitations  GTB    Extension Limitations  bil hip hikes on 2" step, 2x10    Wall Squat  15 reps    Wall Squat Limitations  cues for TKE at top    Other Standing Knee Exercises  heel taps on 6" step and TKE at top with BTB 2x15 reps    Other Standing Knee Exercises  sidestepping with GTB 8ft x3RT      Knee/Hip Exercises: Seated   Stool Scoot - Round Trips  fwd/retro x3RT      Knee/Hip Exercises: Supine   Knee Extension  AROM    Knee Extension Limitations  6    Knee Flexion Limitations  110      Manual Therapy   Manual Therapy  Joint mobilization    Manual therapy comments  completed seperately  from all other skilled interventions    Joint Mobilization  all in supine: Grade III PA mobs in ~90 deg flexion, Grade III PA mobs in end range ext; Grade I-II tibial ER joint mobs in end range extension           PT Education - 09/19/17 1033    Education provided  Yes    Education Details  will reassess next visit    Person(s) Educated  Patient    Methods  Explanation    Comprehension  Verbalized understanding       PT Short Term Goals - 08/24/17 1043      PT SHORT TERM GOAL #1   Title  Patient to demonstrate R knee extension ROM as being 10 degrees in order to improve gait mechanics and reduce compensation pattern     Baseline  11/7: 13 deg this date    Time  2    Period  Weeks    Status  On-going      PT SHORT TERM GOAL #2   Title  Patient to demonstrate R knee flexion ROM as being 110 degrees in order to improve gait mechanics and reduce compensation pattern     Baseline  11/7: 100    Time  2    Period  Weeks    Status  On-going      PT SHORT TERM GOAL #3   Title  Patient to  demonstrate consistent heel toe gait pattern, improved TKE, equal stance/swing phases, and elimiination of antalgic pattern in order to improve efficiency and quality of gait     Baseline  11/7: continued deviations    Time  2    Period  Weeks    Status  On-going      PT SHORT TERM GOAL #4   Title  Patient to be compliant with correct performance of HEP, to be updated PRN     Time  1    Period  Weeks    Status  Achieved        PT Long Term Goals - 08/24/17 1043      PT LONG TERM GOAL #1   Title  Patient to demonstrate R knee ROM as being 0-120 degrees in order to improve gait pattern and efficacy of movement     Baseline  11/7: 13-100 degrees (16-96 on 11/5)    Time  4    Period  Weeks    Status  On-going      PT LONG TERM GOAL #2   Title  Patient to be able to reciprocally ascend/descend full flight of stairs with no railing, good eccentric control, in order to show improved home/community access     Baseline  11/7: ascends well but is not confident in descent reciprocal and demos min deviations with descent    Time  4    Period  Weeks    Status  On-going      PT LONG TERM GOAL #3   Title  Patient to be able to tolerate closed chain activity for at least 90 minutes with R knee pain no more than 2/10 in order to assist in return to PLOF     Baseline  11/7: hasn't tried, but on average, she is on her feet comfortably for 45 mins    Time  4    Period  Weeks    Status  On-going            Plan - 09/19/17 1034  Clinical Impression Statement  Pt presents to therapy with no reports of locking up and improved LBP from last session. Continued and progressed functional hip and knee strengthening in order to maximize overall function and ROM. Pt denied any pain during therex. Introduced pt to cybex machines this date with excellent tolerance. Ended session with manual joint mobs for improved extension; added tibial ER joint mob at end range extension and pt stated that she had  slight increased medial knee pain (same location) with Grade III so decreased to I-II with no issues. She still has not heard about her JAS brace. Pt due for reassessment next visit.     Rehab Potential  Fair    Clinical Impairments Affecting Rehab Potential  (+) motivated, good outcomes with PT in the past, no major significant PMH    PT Frequency  2x / week    PT Duration  4 weeks    PT Treatment/Interventions  ADLs/Self Care Home Management;Biofeedback;Cryotherapy;DME Instruction;Gait training;Stair training;Functional mobility training;Therapeutic activities;Therapeutic exercise;Balance training;Neuromuscular re-education;Patient/family education;Manual techniques;Scar mobilization;Passive range of motion;Dry needling;Energy conservation;Taping    PT Next Visit Plan  Reassess; Follow up with [facility].     PT Home Exercise Plan  Eval: quad sets, weighted knee extension stretch, heel slides, knee flexion stretch, hamstring stretch; 11/12: prone quad stretch; 11/26: prone knee hang    Consulted and Agree with Plan of Care  Patient       Patient will benefit from skilled therapeutic intervention in order to improve the following deficits and impairments:  Abnormal gait, Increased fascial restricitons, Pain, Improper body mechanics, Decreased coordination, Decreased mobility, Increased muscle spasms, Postural dysfunction, Decreased scar mobility, Decreased activity tolerance, Decreased range of motion, Decreased strength, Difficulty walking, Increased edema, Impaired flexibility  Visit Diagnosis: Lateral meniscus, posterior horn derangement, right  S/P left knee arthroscopy  Stiffness of right knee, not elsewhere classified  Acute pain of right knee  Difficulty in walking, not elsewhere classified  Localized edema     Problem List Patient Active Problem List   Diagnosis Date Noted  . S/P left knee arthroscopy 05/26/17  08/08/2017  . Lateral meniscus, posterior horn derangement, right        Jac CanavanBrooke Kristy Catoe PT, DPT  [facility] [address] [place], [place], [address] Phone: [phone]   Fax:  [phone]  Name: Emily Simmons MRN: [id number] Date of Birth: [birth year]

## 2017-09-21 ENCOUNTER — Other Ambulatory Visit: Payer: Self-pay

## 2017-09-21 ENCOUNTER — Ambulatory Visit (HOSPITAL_COMMUNITY): Payer: BLUE CROSS/BLUE SHIELD

## 2017-09-21 DIAGNOSIS — M25661 Stiffness of right knee, not elsewhere classified: Secondary | ICD-10-CM

## 2017-09-21 DIAGNOSIS — R262 Difficulty in walking, not elsewhere classified: Secondary | ICD-10-CM

## 2017-09-21 DIAGNOSIS — M23351 Other meniscus derangements, posterior horn of lateral meniscus, right knee: Secondary | ICD-10-CM

## 2017-09-21 DIAGNOSIS — Z9889 Other specified postprocedural states: Secondary | ICD-10-CM

## 2017-09-21 DIAGNOSIS — M25561 Pain in right knee: Secondary | ICD-10-CM

## 2017-09-21 DIAGNOSIS — R600 Localized edema: Secondary | ICD-10-CM

## 2017-09-21 NOTE — Patient Instructions (Signed)
  Mini Squats  Stand facing a counter, with feet shoulder width apart. Use your hands for balance if you need to. Perform a mini squat and back up to straight.   ELASTIC BAND LATERAL WALKS   With an elastic band around both ankles, walk to the side while keeping your feet spread apart. Keep your knees bent the entire time.   FRONT STEP-UPS  Step up onto stool or step with involved leg.  Step down leading with uninvolved leg.  Repeat.   LATERAL STEP-UPS  Stand on stool or step with involved leg.  Lower the uninvolved leg until the heel touches the floor, then press back up using the muscles in the involved leg only.  Repeat.   LOOPED ELASTIC BAND HIP ABDUCTION  While standing with an elastic band looped around your ankles, move the target leg out to the side as shown.    WALL SQUATS  Leaning up against a wall or closed door on your back, slide your body downward and then return back to upright position.  A door was used here because it was smoother and had less friction than the wall.   Knees should bend in line with the 2nd toe and not pass the front of the foot.    Stretch every day. Strengthen at least 3-4x/week (at least every other day) -- Pick 3-4 strengthening exercises to perform one day and then do the others the next time. Start with green band and progress to the blue band.

## 2017-09-21 NOTE — Therapy (Signed)
[place], [place], [address] Phone: [phone]   Fax:  [phone]  Physical Therapy Treatment/Discharge Summary  Patient Details  Name: Emily Simmons MRN: [id number] Date of Birth: 1976-03-10 Referring Provider: Arther Abbott    Encounter Date: 09/21/2017  PT End of Session - 09/21/17 0946    Visit Number  19    Number of Visits  25    Date for PT Re-Evaluation  09/21/17 done 11/5     Authorization Type  [employer] Other (30 visit limit)    Authorization Time Period  07/25/17 to 08/25/17; NEW: 08/24/17 to 09/21/17    Authorization - Visit Number  101    Authorization - Number of Visits  30    PT Start Time  0946    PT Stop Time  1021    PT Time Calculation (min)  35 min    Activity Tolerance  Patient tolerated treatment well;Patient limited by pain    Behavior During Therapy  [facility] for tasks assessed/performed       Past Medical History:  Diagnosis Date  . Arthritis    Right knee  . Chronic back pain 2008  . Diabetes mellitus without complication ([place])   . GERD (gastroesophageal reflux disease)     Past Surgical History:  Procedure Laterality Date  . CARPAL TUNNEL RELEASE Right   . KNEE ARTHROSCOPY WITH LATERAL MENISECTOMY Right 05/26/2017   Procedure: KNEE ARTHROSCOPY WITH LATERAL MENISECTOMY;  Surgeon: Carole Civil, MD;  Location: [facility];  Service: Orthopedics;  Laterality: Right;  . KNEE SURGERY Right   . TUBAL LIGATION      There were no vitals filed for this visit.  Subjective Assessment - 09/21/17 0948    Subjective  Pt reports that the [place] representative contacted her and stated everything was approved and that he will meet her Monday to get her fitted. She denies any pain, just stiffness.    Currently in Pain?  No/denies          [facility] PT Assessment - 09/21/17 0001      AROM   Right Knee Extension  10 was 13    Right Knee Flexion  107 was 98         [facility] Adult PT  Treatment/Exercise - 09/21/17 0001      Knee/Hip Exercises: Aerobic   Recumbent Bike  x5 mins at beginning of session for ROM, seat 7          PT Short Term Goals - 09/21/17 0949      PT SHORT TERM GOAL #1   Title  Patient to demonstrate R knee extension ROM as being 10 degrees in order to improve gait mechanics and reduce compensation pattern     Baseline  12/5: 10 deg this date    Time  2    Period  Weeks    Status  Achieved      PT SHORT TERM GOAL #2   Title  Patient to demonstrate R knee flexion ROM as being 110 degrees in order to improve gait mechanics and reduce compensation pattern     Baseline  12/5: 107    Time  2    Period  Weeks    Status  On-going      PT SHORT TERM GOAL #3   Title  Patient to demonstrate consistent heel toe gait pattern, improved TKE, equal stance/swing phases, and elimiination of antalgic pattern in order to improve efficiency and  quality of gait     Baseline  12/5: improved gait with good heel to toe, equal stance/swing phases and no antalgia, but increased R TKE and Trendelenberg continues    Time  2    Period  Weeks    Status  Partially Met      PT SHORT TERM GOAL #4   Title  Patient to be compliant with correct performance of HEP, to be updated PRN     Time  1    Period  Weeks    Status  Achieved        PT Long Term Goals - 09/21/17 8309      PT LONG TERM GOAL #1   Title  Patient to demonstrate R knee ROM as being 0-120 degrees in order to improve gait pattern and efficacy of movement     Baseline  12/5: 10-107deg     Time  4    Period  Weeks    Status  On-going      PT LONG TERM GOAL #2   Title  Patient to be able to reciprocally ascend/descend full flight of stairs with no railing, good eccentric control, in order to show improved home/community access     Baseline  12/5: WFL with only min deviations, confident with reciprocal stair ambulation now    Time  4    Period  Weeks    Status  Achieved      PT LONG TERM GOAL #3    Title  Patient to be able to tolerate closed chain activity for at least 90 minutes with R knee pain no more than 2/10 in order to assist in return to PLOF     Baseline  12/5: she states that she can tolerate 90+ mins of closed chain activities without R knee pain    Time  4    Period  Weeks    Status  Achieved            Plan - 09/21/17 1021    Clinical Impression Statement  PT reassessed pt's goals and outcome measures this date. Pt has met all of her goals except for her STG and LTG for ROM. She was 10-107deg AROM today. Pt's stairs and ambulation have significantly improved, though minor deviations continue. Overall, pt is not limited in her function at home or in the community and she does not have knee pain anymore. Pt was contacted by the [employer] brace representative and he will be fitting her for her brace on Monday 12/10. At this time, pt does not have any functional deficits and since her ROM has not responded to multiple different interventions, pt will be discharged to her HEP. PT provided updated hip and knee strenghtening exercises and ensured understanding. Pt was educated that she can return with a referral from her MD if she notices a significant decline in her function and she verbalized understanding.     Rehab Potential  Fair    Clinical Impairments Affecting Rehab Potential  (+) motivated, good outcomes with PT in the past, no major significant PMH    PT Frequency  2x / week    PT Duration  4 weeks    PT Treatment/Interventions  ADLs/Self Care Home Management;Biofeedback;Cryotherapy;DME Instruction;Gait training;Stair training;Functional mobility training;Therapeutic activities;Therapeutic exercise;Balance training;Neuromuscular re-education;Patient/family education;Manual techniques;Scar mobilization;Passive range of motion;Dry needling;Energy conservation;Taping    PT Next Visit Plan  discharged    PT Home Exercise Plan  Eval: quad sets, weighted knee extension stretch,  heel slides,  knee flexion stretch, hamstring stretch; 11/12: prone quad stretch; 11/26: prone knee hang; 12/5: mini squats, sidestep with band, hip abd with band, wall squats, fwd/lat step ups    Consulted and Agree with Plan of Care  Patient       Patient will benefit from skilled therapeutic intervention in order to improve the following deficits and impairments:  Abnormal gait, Increased fascial restricitons, Pain, Improper body mechanics, Decreased coordination, Decreased mobility, Increased muscle spasms, Postural dysfunction, Decreased scar mobility, Decreased activity tolerance, Decreased range of motion, Decreased strength, Difficulty walking, Increased edema, Impaired flexibility  Visit Diagnosis: Lateral meniscus, posterior horn derangement, right  S/P left knee arthroscopy  Stiffness of right knee, not elsewhere classified  Acute pain of right knee  Difficulty in walking, not elsewhere classified  Localized edema     Problem List Patient Active Problem List   Diagnosis Date Noted  . S/P left knee arthroscopy 05/26/17  08/08/2017  . Lateral meniscus, posterior horn derangement, right        PHYSICAL THERAPY DISCHARGE SUMMARY  Visits from Start of Care: 19  Current functional level related to goals / functional outcomes: See clinical impression above   Remaining deficits: See clinical impression above   Education / Equipment: HEP Plan: Patient agrees to discharge.  Patient goals were met. Patient is being discharged due to meeting the stated rehab goals.  ?????        Geraldine Solar PT, [place] [address] [place], [place], [address] Phone: [phone]   Fax:  [phone]  Name: KAORU BENDA MRN: [id number] Date of Birth: 1976-08-09

## 2017-10-05 ENCOUNTER — Ambulatory Visit (INDEPENDENT_AMBULATORY_CARE_PROVIDER_SITE_OTHER): Payer: BLUE CROSS/BLUE SHIELD | Admitting: Orthopedic Surgery

## 2017-10-05 ENCOUNTER — Encounter: Payer: Self-pay | Admitting: Orthopedic Surgery

## 2017-10-05 VITALS — BP 123/81 | HR 102 | Ht 68.0 in | Wt 215.0 lb

## 2017-10-05 DIAGNOSIS — M23300 Other meniscus derangements, unspecified lateral meniscus, right knee: Secondary | ICD-10-CM

## 2017-10-05 DIAGNOSIS — Z9889 Other specified postprocedural states: Secondary | ICD-10-CM

## 2017-10-05 DIAGNOSIS — M25661 Stiffness of right knee, not elsewhere classified: Secondary | ICD-10-CM | POA: Diagnosis not present

## 2017-10-05 MED ORDER — HYDROCODONE-ACETAMINOPHEN 5-325 MG PO TABS: 1.0000 | ORAL_TABLET | Freq: Three times a day (TID) | ORAL | 0 refills | Status: DC | PRN

## 2017-10-05 NOTE — Progress Notes (Signed)
Progress Note   Patient ID: Emily Simmons, female   DOB: 27-Dec-1975, 41 y.o.   MRN: [id number]  Chief Complaint  Patient presents with  . Follow-up    Recheck on right knee, DOS 05-26-17.    HPI 41 year old female had arthroscopy right knee in August, had persistent stiffness (which was present preop) after surgery and has some mild anteromedial knee pain.  The splint for range of motion has improved her range of motion and she just has some residual anteromedial  Review of Systems  Musculoskeletal: Positive for joint pain.  Neurological: Negative for tingling.   Current Meds  Medication Sig  . HYDROcodone-acetaminophen (NORCO/VICODIN) 5-325 MG tablet Take 1 tablet by mouth every 6 (six) hours as needed for moderate pain.  Marland Kitchen. ibuprofen (ADVIL,MOTRIN) 200 MG tablet Take 600 mg by mouth every 6 (six) hours as needed for moderate pain.    No Known Allergies   BP 123/81   Pulse (!) 102   Ht 5\' 8"  (1.727 m)   Wt 215 lb (97.5 kg)   BMI 32.69 kg/m   Physical Exam  Constitutional: She is oriented to person, place, and time. She appears well-developed and well-nourished.  Musculoskeletal:       Legs: Neurological: She is alert and oriented to person, place, and time. Gait normal.  Psychiatric: She has a normal mood and affect. Judgment normal.  Vitals reviewed.    Medical decision-making Encounter Diagnoses  Name Primary?  . S/P right knee arthroscopy 05/26/17 Yes  . Stiffness of right knee     Recommend return to work January 2 with a knee sleeve  Medications refilled.  Follow-up in 2 months       Fuller CanadaStanley Harrison, MD 10/05/2017 9:21 AM

## 2017-10-05 NOTE — Patient Instructions (Signed)
Return to work January 2  Knee sleeve when at work  Continue range of motion splint

## 2017-10-25 ENCOUNTER — Telehealth: Payer: Self-pay | Admitting: Orthopedic Surgery

## 2017-10-25 NOTE — Telephone Encounter (Signed)
Patient requested refill on Hydrocodone/Acetaminophen 5-325  Mgs.   Qty  42        Sig: Take 1 tablet by mouth every 8 (eight) hours as needed for moderate pain.     [facility] [place]

## 2017-10-26 ENCOUNTER — Other Ambulatory Visit: Payer: Self-pay | Admitting: Orthopedic Surgery

## 2017-10-26 DIAGNOSIS — M25661 Stiffness of right knee, not elsewhere classified: Secondary | ICD-10-CM

## 2017-10-26 DIAGNOSIS — Z9889 Other specified postprocedural states: Secondary | ICD-10-CM

## 2017-10-26 DIAGNOSIS — M23300 Other meniscus derangements, unspecified lateral meniscus, right knee: Secondary | ICD-10-CM

## 2017-10-26 MED ORDER — HYDROCODONE-ACETAMINOPHEN 5-325 MG PO TABS: 1.0000 | ORAL_TABLET | Freq: Three times a day (TID) | ORAL | 0 refills | Status: DC | PRN

## 2017-10-26 NOTE — Progress Notes (Signed)
pmpaware 400

## 2017-11-01 ENCOUNTER — Telehealth: Payer: Self-pay | Admitting: Orthopedic Surgery

## 2017-11-01 NOTE — Telephone Encounter (Signed)
Call from pharmacist, Almeta MonasChase, [facility] [place] - ph# [phone]- states patient is there requesting a refill on the following: traMADol-acetaminophen (ULTRACET) 37.5-325 MG tablet 90 tablet   and states patient had also just had Hydrocodone prescription filled. Please advise.

## 2017-11-01 NOTE — Telephone Encounter (Signed)
Do you want her to use Ultracet and Hydrocodone? She indicated on 10/05/17 she was not using Ultracet, but has been filling both  I told pharmacy to hold the refill of Ultracet until I speak to you / will call them tomorrow  Hydrocodone was filled on 10/27/17

## 2017-11-02 ENCOUNTER — Other Ambulatory Visit: Payer: Self-pay | Admitting: Orthopedic Surgery

## 2017-11-02 MED ORDER — TRAMADOL-ACETAMINOPHEN 37.5-325 MG PO TABS: 1.0000 | ORAL_TABLET | ORAL | 5 refills | Status: DC | PRN

## 2017-11-02 MED ORDER — PREDNISONE 10 MG PO TABS: 10.0000 mg | ORAL_TABLET | Freq: Three times a day (TID) | ORAL | 0 refills | Status: DC

## 2017-11-02 NOTE — Telephone Encounter (Signed)
Hydrocodone and Ultracet are the prescribed meds Do you want me to tell her to d/c the Hydrocodone?

## 2017-11-02 NOTE — Progress Notes (Signed)
360 narx

## 2017-11-02 NOTE — Telephone Encounter (Signed)
I will put her on ultracet and prednisone stop the ibuprofen

## 2017-11-02 NOTE — Telephone Encounter (Signed)
YES

## 2017-11-10 ENCOUNTER — Telehealth: Payer: Self-pay | Admitting: Radiology

## 2017-11-10 NOTE — Telephone Encounter (Signed)
request received via fax for update on return to work status.   AT OV 10/05/17 your note indicates ROM 0-125 with anticipated return to work 10/19/17 They want to know why she could not have returned to work on 10/15/17 (this was a Saturday, Jan 1st was a [place])  I have put on the paper work 10/15/17 was a Saturday 10/18/17 was a holiday, so Return to work was written for Jan 2nd, which would have made sense to us when writing the note.   I have put this on your desk, if you agree sign.

## 2017-11-10 NOTE — Telephone Encounter (Signed)
Unable to reach patient.

## 2017-11-11 NOTE — Telephone Encounter (Signed)
Faxed Dr Mort SawyersHarrison's response to [employer] / Attention Orbie Pyoina Walton-Groom, RN, BSN, nurse disability consultant, fax #[phone], verifying return to work date of 10/19/17.  Patient notified.

## 2017-11-24 ENCOUNTER — Other Ambulatory Visit: Payer: Self-pay | Admitting: Orthopedic Surgery

## 2017-12-07 ENCOUNTER — Ambulatory Visit: Payer: BLUE CROSS/BLUE SHIELD | Admitting: Orthopedic Surgery

## 2017-12-07 ENCOUNTER — Other Ambulatory Visit: Payer: Self-pay | Admitting: Orthopedic Surgery

## 2017-12-07 ENCOUNTER — Telehealth: Payer: Self-pay | Admitting: Orthopedic Surgery

## 2017-12-07 VITALS — BP 125/83 | HR 86 | Ht 68.0 in | Wt 215.0 lb

## 2017-12-07 DIAGNOSIS — Z9889 Other specified postprocedural states: Secondary | ICD-10-CM | POA: Diagnosis not present

## 2017-12-07 MED ORDER — IBUPROFEN 200 MG PO TABS: 600.0000 mg | ORAL_TABLET | Freq: Four times a day (QID) | ORAL | 5 refills | Status: DC | PRN

## 2017-12-07 NOTE — Telephone Encounter (Signed)
I can do ibuprofen but not an opioid this far from surgery

## 2017-12-07 NOTE — Telephone Encounter (Signed)
Spoke with patient and relayed message. She is ok with Ibuprofen being sent.  PATIENT USES [facility]

## 2017-12-07 NOTE — Telephone Encounter (Signed)
Pt is requesting a prescription of Hydrocodone. She stated she is experiencing some stiffness and thought you was going to prescribe this for her.    PATIENT USES [facility]

## 2017-12-07 NOTE — Progress Notes (Signed)
Progress Note   Patient ID: Emily Simmons, female   DOB: Jan 19, 1976, 42 y.o.   MRN: [id number]  Chief Complaint  Patient presents with  . Follow-up    Recheck on right knee, DOS 05-26-17.    42 year old female status post arthroscopy right knee back in August had pre-and postop range of motion deficits which were corrected with therapy in extension splinting comes in for routine follow-up.  Complains of occasional pain occasional feelings of instability treated successfully with a brace     Review of Systems  Constitutional: Negative for fever.  Neurological: Negative for tingling.   No outpatient medications have been marked as taking for the 12/07/17 encounter (Office Visit) with Vickki HearingHarrison, Sylvanna Burggraf E, MD.    No Known Allergies   BP 125/83   Pulse 86   Ht 5\' 8"  (1.727 m)   Wt 215 lb (97.5 kg)   BMI 32.69 kg/m   Physical Exam  Constitutional: She is oriented to person, place, and time. She appears well-developed and well-nourished.  Musculoskeletal:       Right knee: She exhibits normal range of motion, no swelling, no effusion, no deformity, normal alignment, no LCL laxity and no MCL laxity. No tenderness found.  Neurological: She is alert and oriented to person, place, and time. Gait normal.  Psychiatric: She has a normal mood and affect. Judgment normal.  Vitals reviewed.    Medical decision-making Encounter Diagnosis  Name Primary?  . S/P right knee arthroscopy 05/26/17 Yes   Assessment and plan.  The patient is doing well she will use the knee sleeve and as needed otherwise no further interventions needed she will follow-up on as-needed basis      Fuller CanadaStanley Adaliz Dobis, MD 12/07/2017 9:18 AM

## 2018-05-12 ENCOUNTER — Emergency Department (HOSPITAL_COMMUNITY): Payer: BLUE CROSS/BLUE SHIELD

## 2018-05-12 ENCOUNTER — Encounter (HOSPITAL_COMMUNITY): Payer: Self-pay | Admitting: Emergency Medicine

## 2018-05-12 ENCOUNTER — Emergency Department (HOSPITAL_COMMUNITY)
Admission: EM | Admit: 2018-05-12 | Discharge: 2018-05-12 | Disposition: A | Discharge status: Home / Self Care | Payer: BLUE CROSS/BLUE SHIELD | Attending: Emergency Medicine | Admitting: Emergency Medicine

## 2018-05-12 ENCOUNTER — Other Ambulatory Visit: Payer: Self-pay

## 2018-05-12 DIAGNOSIS — M79604 Pain in right leg: Secondary | ICD-10-CM | POA: Diagnosis not present

## 2018-05-12 DIAGNOSIS — M7731 Calcaneal spur, right foot: Secondary | ICD-10-CM | POA: Insufficient documentation

## 2018-05-12 DIAGNOSIS — E119 Type 2 diabetes mellitus without complications: Secondary | ICD-10-CM | POA: Diagnosis not present

## 2018-05-12 DIAGNOSIS — R079 Chest pain, unspecified: Secondary | ICD-10-CM | POA: Diagnosis not present

## 2018-05-12 DIAGNOSIS — M62838 Other muscle spasm: Secondary | ICD-10-CM | POA: Diagnosis not present

## 2018-05-12 DIAGNOSIS — M25511 Pain in right shoulder: Secondary | ICD-10-CM | POA: Diagnosis present

## 2018-05-12 DIAGNOSIS — F17210 Nicotine dependence, cigarettes, uncomplicated: Secondary | ICD-10-CM | POA: Insufficient documentation

## 2018-05-12 DIAGNOSIS — M25571 Pain in right ankle and joints of right foot: Secondary | ICD-10-CM | POA: Diagnosis not present

## 2018-05-12 DIAGNOSIS — M5410 Radiculopathy, site unspecified: Secondary | ICD-10-CM

## 2018-05-12 DIAGNOSIS — M779 Enthesopathy, unspecified: Secondary | ICD-10-CM | POA: Diagnosis not present

## 2018-05-12 DIAGNOSIS — M7750 Other enthesopathy of unspecified foot: Secondary | ICD-10-CM

## 2018-05-12 LAB — BASIC METABOLIC PANEL
Anion gap: 9 (ref 5–15)
BUN: 8 mg/dL (ref 6–20)
CO2: 25 mmol/L (ref 22–32)
Calcium: 9.1 mg/dL (ref 8.9–10.3)
Chloride: 106 mmol/L (ref 98–111)
Creatinine, Ser: 0.7 mg/dL (ref 0.44–1.00)
GFR calc Af Amer: >60 mL/min (ref >60)
GFR calc non Af Amer: >60 mL/min (ref >60)
Glucose, Bld: 93 mg/dL (ref 70–99)
POTASSIUM: 3.7 mmol/L (ref 3.5–5.1)
Sodium: 140 mmol/L (ref 135–145)

## 2018-05-12 LAB — D-DIMER, QUANTITATIVE: D-Dimer, Quant: 0.3 ug/mL-FEU (ref 0.00–0.50)

## 2018-05-12 LAB — CBC
HCT: 41.3 % (ref 36.0–46.0)
Hemoglobin: 13.7 g/dL (ref 12.0–15.0)
MCH: 27.4 pg (ref 26.0–34.0)
MCHC: 33.2 g/dL (ref 30.0–36.0)
MCV: 82.6 fL (ref 78.0–100.0)
Platelets: 232 10*3/uL (ref 150–400)
RBC: 5 MIL/uL (ref 3.87–5.11)
RDW: 14.6 % (ref 11.5–15.5)
WBC: 15.8 10*3/uL — AB (ref 4.0–10.5)

## 2018-05-12 LAB — TROPONIN I

## 2018-05-12 MED ORDER — PREDNISONE 50 MG PO TABS
60.0000 mg | ORAL_TABLET | Freq: Once | ORAL | Status: AC
  Administered 2018-05-12: 60 mg via ORAL
  Filled 2018-05-12: qty 1

## 2018-05-12 MED ORDER — METHYLPREDNISOLONE 4 MG PO TBPK: ORAL_TABLET | ORAL | 0 refills | Status: DC

## 2018-05-12 MED ORDER — NAPROXEN 500 MG PO TABS: 500.0000 mg | ORAL_TABLET | Freq: Two times a day (BID) | ORAL | 0 refills | Status: DC

## 2018-05-12 MED ORDER — KETOROLAC TROMETHAMINE 30 MG/ML IJ SOLN
30.0000 mg | Freq: Once | INTRAMUSCULAR | Status: AC
  Administered 2018-05-12: 30 mg via INTRAMUSCULAR
  Filled 2018-05-12: qty 1

## 2018-05-12 MED ORDER — METHOCARBAMOL 500 MG PO TABS
1000.0000 mg | ORAL_TABLET | Freq: Once | ORAL | Status: AC
  Administered 2018-05-12: 1000 mg via ORAL
  Filled 2018-05-12: qty 2

## 2018-05-12 MED ORDER — METHOCARBAMOL 500 MG PO TABS: 500.0000 mg | ORAL_TABLET | Freq: Two times a day (BID) | ORAL | 0 refills | Status: DC

## 2018-05-12 NOTE — ED Provider Notes (Signed)
[facility] EMERGENCY DEPARTMENT Provider Note   CSN: [id number] Arrival date & time: 05/12/18  1032     History   Chief Complaint Chief Complaint  Patient presents with  . Numbness    HPI Emily Simmons is a 42 y.o. female.  Emily Simmons is a 42 y.o. Female with history of diabetes, back pain, GERD and arthritis, who presents to the emergency department for evaluation of 2 days of shoulder and upper chest pain that radiates down the right arm with associated tingling.  Patient also reports this morning she developed severe pain in her right calf over the back of the ankle that is very tender even just to the touch.  Patient reports shoulder pain started 2 days ago and has gotten persistently worse and is a constant ache, pain over the upper right chest is worse with deep breathing and radiates back to her shoulder blade.  She denies any associated central chest pain, pain is not worse with exertion.  Pain is worse with movement and deep breathing.  She has not tried anything to treat this pain.  She does not smoke about 1/4 pack of cigarettes a day, no history of cardiac events.  Patient reports sudden onset of pain over the back of the ankle is exquisitely tender to palpation.  No recent surgeries, or immobilization, no long distance travel, not currently on OCPs, no history of blood clot.  No new injury to the ankle or calf.  No noted swelling, redness or warmth.  Pain is worse with weightbearing.,  Of note pain in the upper chest and shoulder began before the calf.     Past Medical History:  Diagnosis Date  . Arthritis    Right knee  . Chronic back pain 2008  . Diabetes mellitus without complication (HCC)   . GERD (gastroesophageal reflux disease)     Patient Active Problem List   Diagnosis Date Noted  . 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 S/P right knee arthroscopy 05/26/17 08/08/2017  . Lateral meniscus, posterior horn derangement, right     Past  Surgical History:  Procedure Laterality Date  . CARPAL TUNNEL RELEASE Right   . KNEE ARTHROSCOPY WITH LATERAL MENISECTOMY Right 05/26/2017   Procedure: KNEE ARTHROSCOPY WITH LATERAL MENISECTOMY;  Surgeon: Vickki Hearing, MD;  Location: [facility];  Service: Orthopedics;  Laterality: Right;  . KNEE SURGERY Right   . TUBAL LIGATION       OB History   None      Home Medications    Prior to Admission medications   Medication Sig Start Date End Date Taking? Authorizing Provider  ibuprofen (ADVIL,MOTRIN) 200 MG tablet Take 3 tablets (600 mg total) by mouth every 6 (six) hours as needed for moderate pain. 12/07/17  Yes Vickki Hearing, MD  traMADol-acetaminophen (ULTRACET) 37.5-325 MG tablet Take 1 tablet by mouth every 4 (four) hours as needed. 11/02/17  Yes Vickki Hearing, MD  methocarbamol (ROBAXIN) 500 MG tablet Take 1 tablet (500 mg total) by mouth 2 (two) times daily. 05/12/18   Dartha Lodge, PA-C  methylPREDNISolone (MEDROL DOSEPAK) 4 MG TBPK tablet Take as directed 05/12/18   Dartha Lodge, PA-C  naproxen (NAPROSYN) 500 MG tablet Take 1 tablet (500 mg total) by mouth 2 (two) times daily. 05/12/18   Dartha Lodge, PA-C    Family History Family History  Problem Relation Age of Onset  . Diabetes Mother   . Hypertension Mother   .  Asthma Mother   . Diabetes Father   . Asthma Father   . Hypertension Sister     Social History Social History   Tobacco Use  . Smoking status: Current Every Day Smoker    Packs/day: 0.50    Types: Cigarettes  . Smokeless tobacco: Never Used  Substance Use Topics  . Alcohol use: Yes    Comment: occ  . Drug use: No     Allergies   Patient has no known allergies.   Review of Systems Review of Systems  Constitutional: Negative for chills and fever.  HENT: Negative.   Eyes: Negative for visual disturbance.  Respiratory: Positive for chest tightness. Negative for cough, shortness of breath and wheezing.   Cardiovascular: Positive  for chest pain. Negative for palpitations and leg swelling.  Gastrointestinal: Negative for abdominal pain, nausea and vomiting.  Genitourinary: Negative for dysuria and frequency.  Musculoskeletal: Positive for neck pain. Negative for arthralgias, back pain, joint swelling, myalgias and neck stiffness.  Skin: Negative for color change and rash.  Neurological: Negative for dizziness, weakness and numbness.       [place]     Physical Exam Updated Vital Signs BP 106/87   Pulse 76   Temp 98 F (36.7 C) (Oral)   Resp 19   Ht 5\' 7"  (1.702 m)   Wt 104.3 kg (230 lb)   LMP 05/08/2018   SpO2 100%   BMI 36.02 kg/m   Physical Exam  Constitutional: She is oriented to person, place, and time. She appears well-developed and well-nourished. No distress.  HENT:  Head: Normocephalic and atraumatic.  Mouth/Throat: Oropharynx is clear and moist.  Eyes: Right eye exhibits no discharge. Left eye exhibits no discharge.  Neck:  Tenderness to palpation over midline and right paraspinal muscles, no palpable derformity  Cardiovascular: Normal rate, regular rhythm, normal heart sounds and intact distal pulses. Exam reveals no gallop and no friction rub.  No murmur heard. Pulmonary/Chest: Effort normal and breath sounds normal. No respiratory distress. She exhibits tenderness.  Respirations equal and unlabored, patient able to speak in full sentences, lungs clear to auscultation bilaterally, tenderness to palpation over the right upper chest wall, no palpable deformity, no overlying skin changes  Abdominal: Soft. Bowel sounds are normal. She exhibits no distension and no mass. There is no tenderness. There is no guarding.  Abdomen soft, nondistended, nontender to palpation in all quadrants without guarding or peritoneal signs  Musculoskeletal: She exhibits tenderness.  Tenderness to palpation over the right shoulder, no overlying redness or warmth, no palpable deformity, pain with range of motion,  which radiates down the right arm.  2+ radial pulses in bilateral upper good capillary refill. Tenderness to palpation the right calf and right Achilles tendon is exquisitely tender to the patient, no overlying redness or warmth. No palpable deformity.  Neurological: She is alert and oriented to person, place, and time. Coordination normal.  Speech is clear, able to follow commands CN III-XII intact Normal strength in upper and lower extremities bilaterally including dorsiflexion and plantar flexion, strong and equal grip strength Sensation normal to light and sharp touch Moves extremities without ataxia, coordination intact  Skin: Skin is warm and dry. Capillary refill takes less than 2 seconds. She is not diaphoretic.  Psychiatric: She has a normal mood and affect. Her behavior is normal.  Nursing note and vitals reviewed.    ED Treatments / Results  Labs (all labs ordered are listed, but only abnormal results are displayed) Labs Reviewed  CBC - Abnormal; Notable for the following components:      Result Value   WBC 15.8 (*)    All other components within normal limits  BASIC METABOLIC PANEL  D-DIMER, QUANTITATIVE (NOT AT [facility])  TROPONIN I    EKG EKG Interpretation  Date/Time:  Friday May 12 2018 13:50:34 EDT Ventricular Rate:  73 PR Interval:    QRS Duration: 97 QT Interval:  393 QTC Calculation: 433 R Axis:   -18 Text Interpretation:  Sinus rhythm Borderline left axis deviation Low voltage, precordial leads When compared with ECG of 10/04/2015 No significant change was found Confirmed by Samuel Jester [phone]) on 05/12/2018 1:55:34 PM   Radiology Dg Chest 2 View  Result Date: 05/12/2018 CLINICAL DATA:  Chest pain x 2 days with sob/right ankle pain started today with no known injury/diabetic/smoker EXAM: CHEST - 2 VIEW COMPARISON:  10/04/2015 FINDINGS: Lungs are clear. Heart size and mediastinal contours are within normal limits. No effusion. Visualized bones  unremarkable. IMPRESSION: No acute cardiopulmonary disease. Electronically Signed   By: Corlis Leak M.D.   On: 05/12/2018 13:01   Dg Ankle Complete Right  Result Date: 05/12/2018 CLINICAL DATA:  Chest pain x 2 days with sob/right ankle pain started today with no known injury/diabetic/smoker EXAM: RIGHT ANKLE - COMPLETE 3+ VIEW COMPARISON:  06/07/2016 FINDINGS: There is no evidence of fracture, dislocation, or joint effusion. Small calcaneal spur at the plantar aponeurosis insertion. There is no evidence of arthropathy or other focal bone abnormality. Soft tissues are unremarkable. IMPRESSION: Calcaneal spur, otherwise negative. Electronically Signed   By: Corlis Leak M.D.   On: 05/12/2018 13:02   Ct Cervical Spine Wo Contrast  Result Date: 05/12/2018 CLINICAL DATA:  Cervicalgia with right-sided radicular symptoms EXAM: CT CERVICAL SPINE WITHOUT CONTRAST TECHNIQUE: Multidetector CT imaging of the cervical spine was performed without intravenous contrast. Multiplanar CT image reconstructions were also generated. COMPARISON:  None. FINDINGS: Alignment: There is no evidence spondylolisthesis. Skull base and vertebrae: Skull base and craniocervical junction regions appear normal. No evident fracture. No blastic or lytic bone lesions. Soft tissues and spinal canal: Prevertebral soft tissues and predental space regions are normal. There is no paraspinous lesion. There is no cord or canal hematoma. Disc levels: The disc spaces appear normal. No appreciable facet arthropathy is evident on this study. At C2-3, there is no nerve root edema or effacement. No disc extrusion or stenosis. At C3-4, there is no nerve root edema or effacement. No disc extrusion or stenosis. At C4-5, there is no nerve root edema or effacement. No disc extrusion or stenosis. At C5-6, there is no nerve root edema or effacement. No disc extrusion or stenosis. At C6-7, there is no appreciable nerve root edema or effacement. No disc extrusion or  stenosis. At C7-T1, no nerve root edema or effacement. No disc extrusion or stenosis. Upper chest: Visualized upper lung zones are clear. Other: None IMPRESSION: No appreciable extradural defect. No appreciable facet arthropathy. Disc spaces appear normal. No nerve root edema or effacement. No disc extrusion or stenosis. No demonstrable fracture or spondylolisthesis.  Vaginal CT stones Electronically Signed   By: Bretta Bang III M.D.   On: 05/12/2018 13:56   US Venous Img Lower Right (dvt Study)  Result Date: 05/12/2018 CLINICAL DATA:  42 year old female with right lower extremity pain EXAM: RIGHT LOWER EXTREMITY VENOUS DOPPLER ULTRASOUND TECHNIQUE: Gray-scale sonography with graded compression, as well as color Doppler and duplex ultrasound were performed to evaluate the lower extremity deep venous systems  from the level of the common femoral vein and including the common femoral, femoral, profunda femoral, popliteal and calf veins including the posterior tibial, peroneal and gastrocnemius veins when visible. The superficial great saphenous vein was also interrogated. Spectral Doppler was utilized to evaluate flow at rest and with distal augmentation maneuvers in the common femoral, femoral and popliteal veins. COMPARISON:  None. FINDINGS: Contralateral Common Femoral Vein: Respiratory phasicity is normal and symmetric with the symptomatic side. No evidence of thrombus. Normal compressibility. Common Femoral Vein: No evidence of thrombus. Normal compressibility, respiratory phasicity and response to augmentation. Saphenofemoral Junction: No evidence of thrombus. Normal compressibility and flow on color Doppler imaging. Profunda Femoral Vein: No evidence of thrombus. Normal compressibility and flow on color Doppler imaging. Femoral Vein: No evidence of thrombus. Normal compressibility, respiratory phasicity and response to augmentation. Popliteal Vein: No evidence of thrombus. Normal compressibility,  respiratory phasicity and response to augmentation. Calf Veins: No evidence of thrombus. Normal compressibility and flow on color Doppler imaging. Superficial Great Saphenous Vein: No evidence of thrombus. Normal compressibility. Venous Reflux:  None. Other Findings:  None. IMPRESSION: No evidence of deep venous thrombosis. Electronically Signed   By: Malachy Moan M.D.   On: 05/12/2018 12:53    Procedures Procedures (including critical care time)  Medications Ordered in ED Medications  ketorolac (TORADOL) 30 MG/ML injection 30 mg (30 mg Intramuscular Given 05/12/18 1443)  predniSONE (DELTASONE) tablet 60 mg (60 mg Oral Given 05/12/18 1444)  methocarbamol (ROBAXIN) tablet 1,000 mg (1,000 mg Oral Given 05/12/18 1444)     Initial Impression / Assessment and Plan / ED Course  I have reviewed the triage vital signs and the nursing notes.  Pertinent labs & imaging results that were available during my care of the patient were reviewed by me and considered in my medical decision making (see chart for details).  Patient presents for evaluation of right shoulder pain that radiates down into the arm with associated pleuritic chest pain, and pain on the right calf and ankle.  Pt with normal vitals and in no acute distress. Story is very atypical for ACS, pleuritic chest pain does raise some concern for  PE, although with calf pain started after shoulder chest pain mostly less likely.  Tenderness is very reproducible upper chest shoulder, she has some tenderness over the paraspinal muscles and is more likely a cervical radiculopathy and/or muscle spasm.  Ankle and posterior calf are very tender without palpable deformity, no overlying erythema or warmth.  Will get labs including troponin and d-dimer, EKG, chest x-ray x-ray of the ankle and lower extremity DVT study as well as a cervical CT to assess for  disc disease to account for radicular symptoms.  EKG without concerning ischemic changes.  No acute  electrolyte derangements.  Leukocytosis of 15.8, patient not having any obvious infectious symptoms, no signs of cellulitis or concern for septic arthritis given musculoskeletal complaints.  Normal hemoglobin.  Chest x-ray without any active cardiopulmonary disease.  DVT study negative.  X-ray of the right ankle does show a posterior calcaneal spur which could be causing some impingement on the Achilles tendon and resulting in some tendinitis which could explain her tenderness in this location.  CT of the cervical spine is unremarkable, I think patient's symptoms in her right arm are likely due to muscle spasm.  Will treat with NSAIDs and muscle relaxers.  Pain improved here in the ED.  Patient follow-up with orthopedics.  Provided crutches to help with Achilles tendinitis.  Return  precautions discussed.  Patient expressed understanding and is in agreement  Final Clinical Impressions(s) / ED Diagnoses   Final diagnoses:  Radiculopathy, unspecified spinal region  Trapezius muscle spasm  Calcaneal spur of right foot  Tendonitis of ankle    ED Discharge Orders        Ordered    methocarbamol (ROBAXIN) 500 MG tablet  2 times daily     05/12/18 1441    naproxen (NAPROSYN) 500 MG tablet  2 times daily     05/12/18 1441    methylPREDNISolone (MEDROL DOSEPAK) 4 MG TBPK tablet     05/12/18 1441       Dartha LodgeFord, Isiaha Greenup N, PA-C 05/13/18 1445    Samuel JesterMcManus, Kathleen, DO 05/14/18 [phone]

## 2018-05-12 NOTE — ED Notes (Signed)
Pt ambulated to BR

## 2018-05-12 NOTE — Discharge Instructions (Signed)
Your work-up today is reassuring, labs, imaging and EKG do not suggest an acute problem with your heart or lungs causing your symptoms.  I think you likely have a muscle spasm in your neck and shoulder causing the pain to radiate down your right arm.  Please use steroids, muscle relaxers and anti-inflammatories as directed to help with this pain.  Your calf ultrasound shows no evidence of blood clot, you do have a heel spur which could be causing inflammation of your Achilles tendon and could explain the pain you are having, please ice and elevate, and use crutches to try and stay off this ankle, you can follow-up with orthopedics if symptoms persist.  [facility] Doctor List    Kari BaarsEdward Hawkins MD. Specialty: Pulmonary Disease Contact information: [address]  [address]  [place] [address]  [phone]   Syliva OvermanMargaret Simpson, MD. Specialty: Family Medicine Contact information: [address]  [place] [address]  [phone]   Lilyan PuntScott Luking, MD. Specialty: Family Medicine Contact information: [address]  [address]  [place] [address]  [phone]   Avon Gullyesfaye Fanta, MD Specialty: Internal Medicine Contact information: [address]  [place] [address]  [phone]   Catalina PizzaZach Hall, MD. Specialty: Internal Medicine Contact information: [address]  [place] [address]  [phone]    [facility] (Dr. Selena BattenKim) Specialty: Family Medicine Contact information: [address]  [place] [address]  [phone]   John GiovanniStephen Knowlton, MD. Specialty: Family Medicine Contact information: [address]  [address]  [place] [address]  [phone]   Carylon Perchesoy Fagan, MD. Specialty: Internal Medicine Contact information: [address]  [address]  [place] [address]  [phone]    [facility]  [address] [place] [address] [phone]  Services [facility]   - [facility] offers a variety of basic health services.  Services include but are not limited to: Blood pressure checks  Heart rate checks  Blood sugar checks  Urine analysis  Rapid strep tests  Pregnancy tests.  Health education and referrals  People needing more complex services will be directed to a physician online. Using these virtual visits, doctors can evaluate and prescribe medicine and treatments. There will be no medication on-site, though [facility] will help patients fill their prescriptions at little to no cost.   For More information please go to: [email or web address]

## 2018-05-12 NOTE — ED Triage Notes (Addendum)
Patient complaining of right shoulder pain and numbness x 2 days and right leg pain today. Denies injury.

## 2018-05-15 ENCOUNTER — Other Ambulatory Visit: Payer: Self-pay | Admitting: Orthopedic Surgery

## 2018-05-23 NOTE — Telephone Encounter (Signed)
Has not been seen since February.  No to the narcotic.  Freight [employer]eral law.

## 2018-05-23 NOTE — Telephone Encounter (Signed)
Again, no refill.  Has not been seen since February.

## 2018-07-31 IMAGING — DX DG ANKLE COMPLETE 3+V*R*
3 series · 3 of 3 positions shown · non-contrast
Comparison: 06/07/2016

CLINICAL DATA: Chest pain x 2 days with sob/right ankle pain
started today with no known injury/diabetic/smoker

EXAM:
RIGHT ANKLE - COMPLETE 3+ VIEW

[ankle ap]
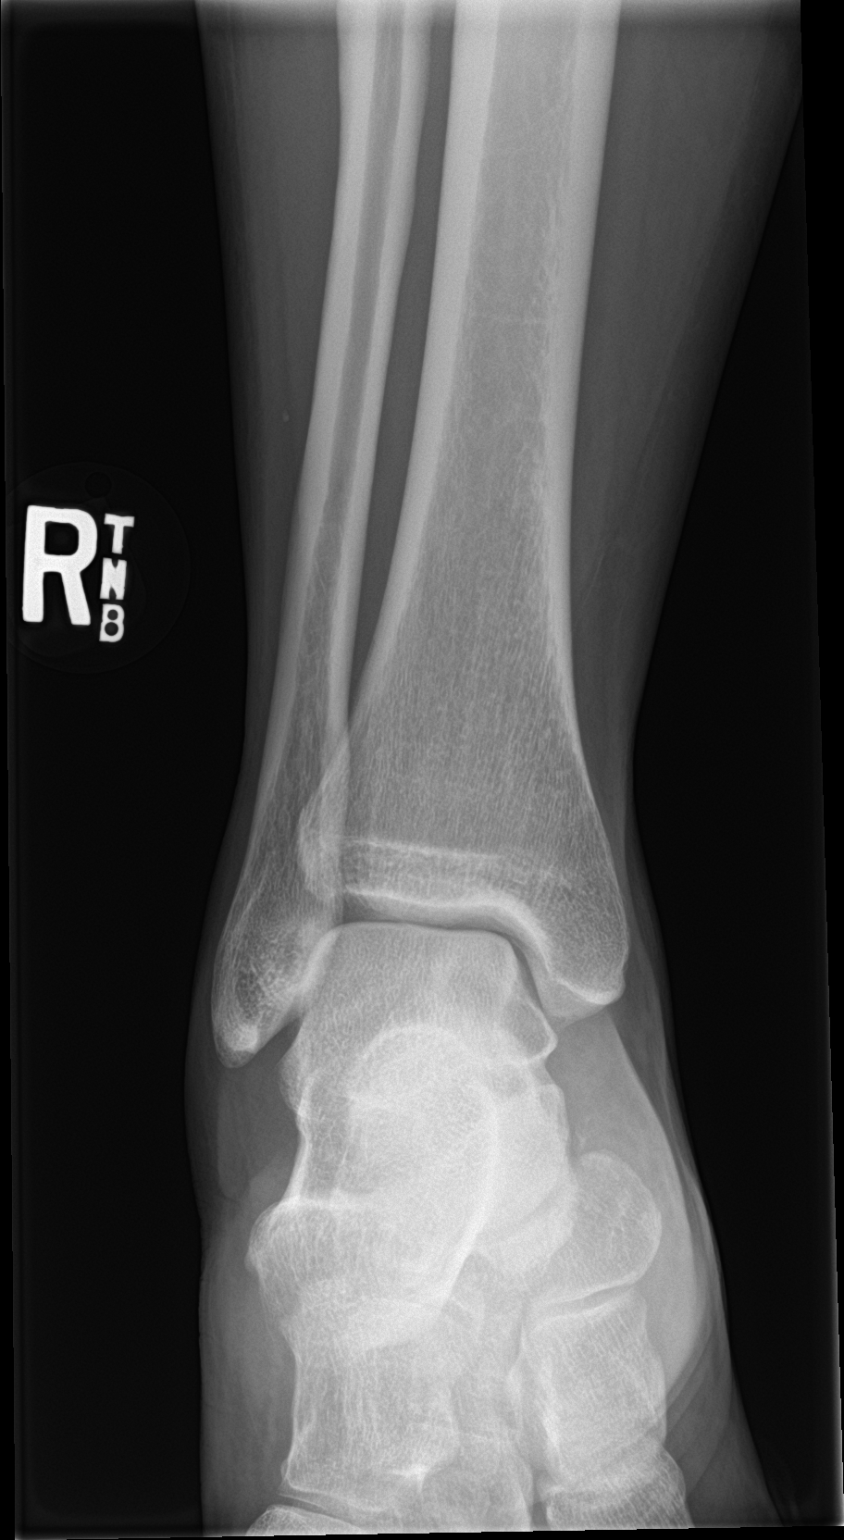

[ankle obl]
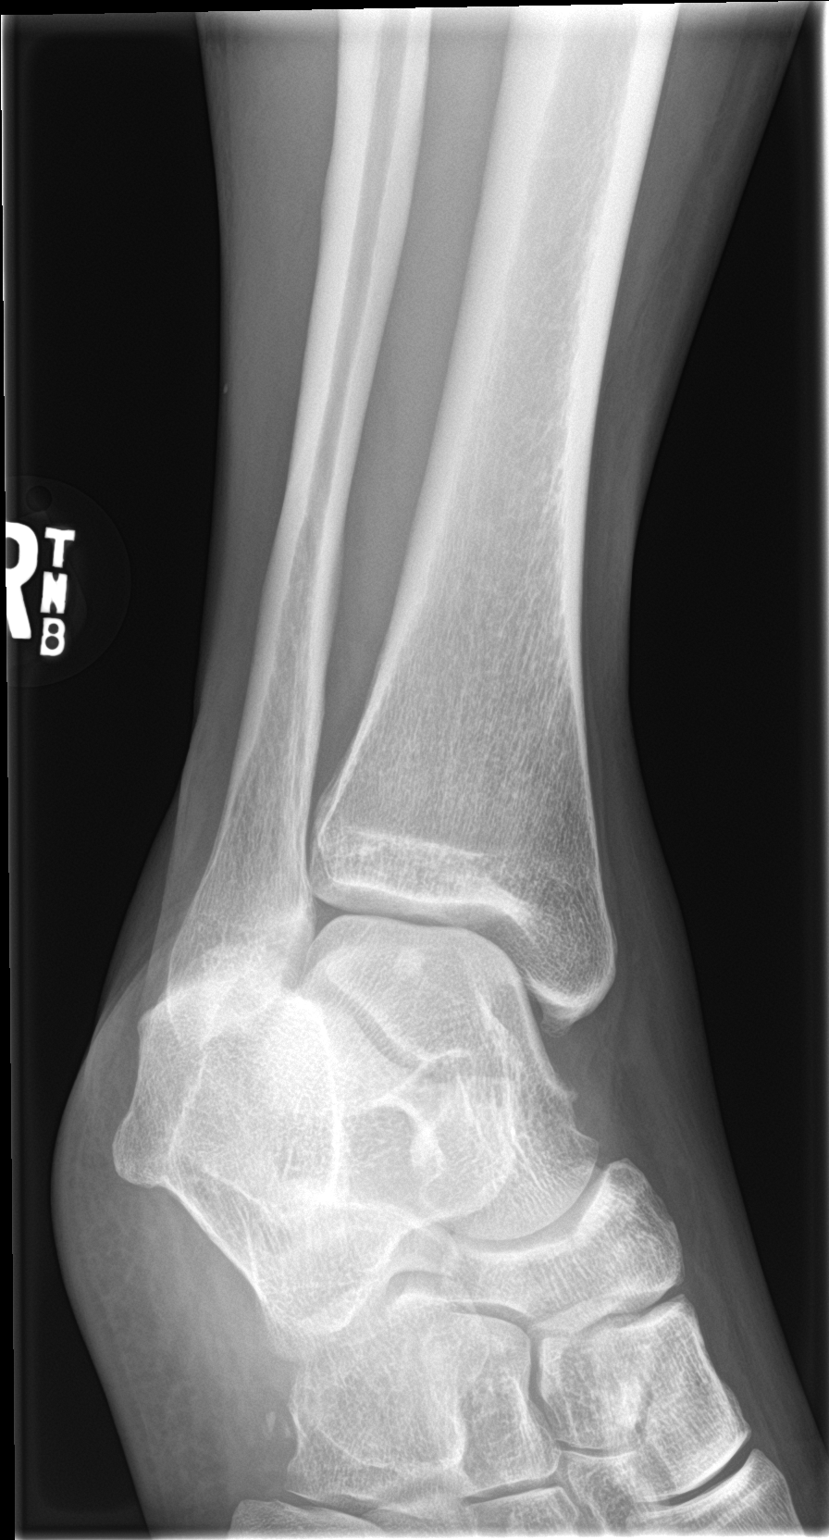

[ankle lat]
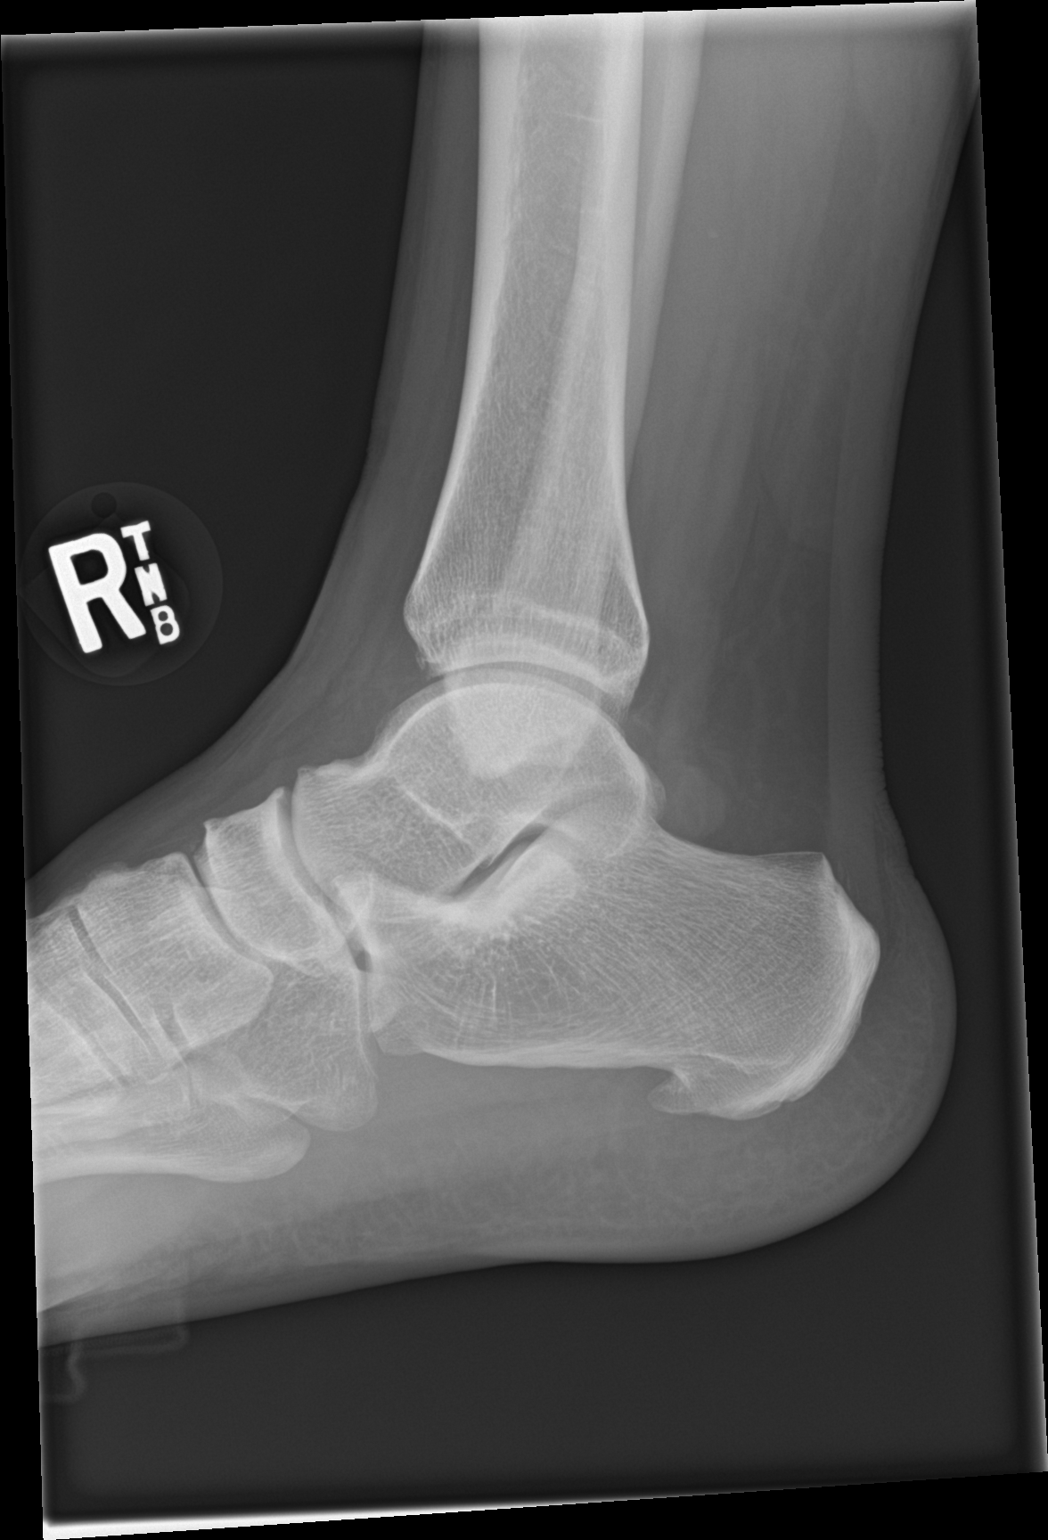

[3 of 3 positions shown; findings below may reference images not displayed]

FINDINGS: There is no evidence of fracture, dislocation, or joint effusion.
Small calcaneal spur at the plantar aponeurosis insertion. There is
no evidence of arthropathy or other focal bone abnormality. Soft
tissues are unremarkable.
IMPRESSION: Calcaneal spur, otherwise negative.

## 2019-01-02 ENCOUNTER — Other Ambulatory Visit: Payer: Self-pay

## 2019-01-02 ENCOUNTER — Encounter (HOSPITAL_COMMUNITY): Payer: Self-pay | Admitting: Emergency Medicine

## 2019-01-02 ENCOUNTER — Emergency Department (HOSPITAL_COMMUNITY)
Admission: EM | Admit: 2019-01-02 | Discharge: 2019-01-02 | Disposition: A | Discharge status: Home / Self Care | Payer: BLUE CROSS/BLUE SHIELD | Attending: Emergency Medicine | Admitting: Emergency Medicine

## 2019-01-02 DIAGNOSIS — R69 Illness, unspecified: Secondary | ICD-10-CM

## 2019-01-02 DIAGNOSIS — J111 Influenza due to unidentified influenza virus with other respiratory manifestations: Secondary | ICD-10-CM | POA: Diagnosis not present

## 2019-01-02 DIAGNOSIS — E119 Type 2 diabetes mellitus without complications: Secondary | ICD-10-CM | POA: Insufficient documentation

## 2019-01-02 DIAGNOSIS — R531 Weakness: Secondary | ICD-10-CM | POA: Diagnosis present

## 2019-01-02 DIAGNOSIS — Z79899 Other long term (current) drug therapy: Secondary | ICD-10-CM | POA: Insufficient documentation

## 2019-01-02 DIAGNOSIS — F17210 Nicotine dependence, cigarettes, uncomplicated: Secondary | ICD-10-CM | POA: Insufficient documentation

## 2019-01-02 LAB — CBG MONITORING, ED: Glucose-Capillary: 83 mg/dL (ref 70–99)

## 2019-01-02 LAB — INFLUENZA PANEL BY PCR (TYPE A & B)
INFLAPCR: NEGATIVE
Influenza B By PCR: NEGATIVE

## 2019-01-02 MED ORDER — IBUPROFEN 400 MG PO TABS
600.0000 mg | ORAL_TABLET | Freq: Once | ORAL | Status: AC
  Administered 2019-01-02: 600 mg via ORAL
  Filled 2019-01-02: qty 2

## 2019-01-02 NOTE — Discharge Instructions (Addendum)
You were seen in the emergency department for headache and fatigue.  Your flu test was negative.  This is likely a viral illness and you will need to get plenty of rest and drink fluids.  Please monitor your temperature and self quarantine at home.  If your symptoms worsen you should contact your doctor.

## 2019-01-02 NOTE — ED Triage Notes (Signed)
Headache and fatigue x 2 days. Also reports throat feels a little scratchy

## 2019-01-02 NOTE — ED Provider Notes (Signed)
[facility] EMERGENCY DEPARTMENT Provider Note   CSN: [id number] Arrival date & time: 01/02/19  1748    History   Chief Complaint Chief Complaint  Patient presents with  . Weakness    HPI Emily Simmons is a 43 y.o. female.  She is presenting with complaint of intermittent headache and general fatigue for 2 days.  Associated with mild sore throat.  No clear fever no chills no cough no nausea or vomiting.  Has had a little bit of diarrhea.  No urinary symptoms.  No recent travel or sick contacts.  Headache improved with Tylenol and ibuprofen but recurs.     The history is provided by the patient.  Influenza  Presenting symptoms: diarrhea, fatigue, headache, myalgias and sore throat   Presenting symptoms: no cough, no fever, no nausea, no rhinorrhea, no shortness of breath and no vomiting   Severity:  Moderate Onset quality:  Gradual Duration:  2 days Progression:  Waxing and waning Chronicity:  New Relieved by:  OTC medications Worsened by:  Nothing Associated symptoms: no chills, no decreased appetite, no ear pain, no mental status change, no congestion, no neck stiffness and no syncope   Risk factors: diabetes     Past Medical History:  Diagnosis Date  . Arthritis    Right knee  . Chronic back pain 2008  . Diabetes mellitus without complication (HCC)   . GERD (gastroesophageal reflux disease)     Patient Active Problem List   Diagnosis Date Noted  . 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 S/P right knee arthroscopy 05/26/17 08/08/2017  . Lateral meniscus, posterior horn derangement, right     Past Surgical History:  Procedure Laterality Date  . CARPAL TUNNEL RELEASE Right   . KNEE ARTHROSCOPY WITH LATERAL MENISECTOMY Right 05/26/2017   Procedure: KNEE ARTHROSCOPY WITH LATERAL MENISECTOMY;  Surgeon: Vickki Hearing, MD;  Location: [facility];  Service: Orthopedics;  Laterality: Right;  . KNEE SURGERY Right   . TUBAL LIGATION       OB History    No obstetric history on file.      Home Medications    Prior to Admission medications   Medication Sig Start Date End Date Taking? Authorizing Provider  ibuprofen (ADVIL,MOTRIN) 200 MG tablet Take 3 tablets (600 mg total) by mouth every 6 (six) hours as needed for moderate pain. 12/07/17  Yes Vickki Hearing, MD    Family History Family History  Problem Relation Age of Onset  . Diabetes Mother   . Hypertension Mother   . Asthma Mother   . Diabetes Father   . Asthma Father   . Hypertension Sister     Social History Social History   Tobacco Use  . Smoking status: Current Every Day Smoker    Packs/day: 0.50    Types: Cigarettes  . Smokeless tobacco: Never Used  Substance Use Topics  . Alcohol use: Yes    Comment: occ  . Drug use: No     Allergies   Patient has no known allergies.   Review of Systems Review of Systems  Constitutional: Positive for fatigue. Negative for chills, decreased appetite and fever.  HENT: Positive for sore throat. Negative for congestion, ear pain and rhinorrhea.   Eyes: Negative for visual disturbance.  Respiratory: Negative for cough and shortness of breath.   Cardiovascular: Negative for chest pain.  Gastrointestinal: Positive for diarrhea. Negative for abdominal pain, nausea and vomiting.  Genitourinary: Negative for dysuria.  Musculoskeletal: Positive for  myalgias. Negative for neck stiffness.  Skin: Negative for rash.  Neurological: Positive for headaches.     Physical Exam Updated Vital Signs BP (!) 150/99 (BP Location: Right Arm)   Pulse 83   Temp 98.3 F (36.8 C) (Oral)   Resp 18   Ht 5\' 8"  (1.727 m)   Wt 104.3 kg   LMP 12/26/2018   SpO2 99%   BMI 34.97 kg/m   Physical Exam Vitals signs and nursing note reviewed.  Constitutional:      General: She is not in acute distress.    Appearance: She is well-developed.  HENT:     Head: Normocephalic and atraumatic.     Mouth/Throat:     Pharynx: Posterior  oropharyngeal erythema present. No oropharyngeal exudate.  Eyes:     Conjunctiva/sclera: Conjunctivae normal.  Neck:     Musculoskeletal: Neck supple.  Cardiovascular:     Rate and Rhythm: Normal rate and regular rhythm.     Heart sounds: No murmur.  Pulmonary:     Effort: Pulmonary effort is normal. No respiratory distress.     Breath sounds: Normal breath sounds.  Abdominal:     Palpations: Abdomen is soft.     Tenderness: There is no abdominal tenderness.  Musculoskeletal: Normal range of motion.     Right lower leg: No edema.     Left lower leg: No edema.  Lymphadenopathy:     Cervical: No cervical adenopathy.  Skin:    General: Skin is warm and dry.     Capillary Refill: Capillary refill takes less than 2 seconds.  Neurological:     General: No focal deficit present.     Mental Status: She is alert and oriented to person, place, and time.     Gait: Gait normal.      ED Treatments / Results  Labs (all labs ordered are listed, but only abnormal results are displayed) Labs Reviewed  INFLUENZA PANEL BY PCR (TYPE A & B)  CBG MONITORING, ED    EKG None  Radiology No results found.  Procedures Procedures (including critical care time)  Medications Ordered in ED Medications - No data to display   Initial Impression / Assessment and Plan / ED Course  I have reviewed the triage vital signs and the nursing notes.  Pertinent labs & imaging results that were available during my care of the patient were reviewed by me and considered in my medical decision making (see chart for details).        Final Clinical Impressions(s) / ED Diagnoses   Final diagnoses:  Influenza-like illness    ED Discharge Orders    None       Terrilee Files, MD 01/03/19 [phone]

## 2019-04-11 ENCOUNTER — Ambulatory Visit: Payer: BC Managed Care – PPO | Admitting: Orthopedic Surgery

## 2019-04-11 ENCOUNTER — Encounter: Payer: Self-pay | Admitting: Orthopedic Surgery

## 2019-04-11 ENCOUNTER — Other Ambulatory Visit: Payer: Self-pay

## 2019-04-11 VITALS — BP 142/100 | HR 88 | Temp 97.5°F | Ht 67.0 in | Wt 232.0 lb

## 2019-04-11 DIAGNOSIS — G5602 Carpal tunnel syndrome, left upper limb: Secondary | ICD-10-CM

## 2019-04-11 DIAGNOSIS — G5601 Carpal tunnel syndrome, right upper limb: Secondary | ICD-10-CM | POA: Diagnosis not present

## 2019-04-11 DIAGNOSIS — Z87891 Personal history of nicotine dependence: Secondary | ICD-10-CM

## 2019-04-11 MED ORDER — GABAPENTIN 100 MG PO CAPS: 100.0000 mg | ORAL_CAPSULE | Freq: Three times a day (TID) | ORAL | 2 refills | Status: DC

## 2019-04-11 NOTE — Patient Instructions (Signed)
Open Carpal Tunnel Release    Open carpal tunnel release is a surgery to relieve symptoms caused by carpal tunnel syndrome. The carpal tunnel is a narrow, hollow space in the wrist. It is located between the wrist bones and a band of connective tissue (transverse carpal ligament, also known as the flexor retinaculum). The nerve that supplies most of the hand (median nerve) passes through the carpal tunnel, and so do tissues that connect bones to muscles (tendons) in the hand and arm. Carpal tunnel syndrome makes this space swell and become narrow. The swelling pinches the median nerve and causes pain and numbness.  During carpal tunnel release surgery, the transverse carpal ligament is cut to make more room in the carpal tunnel space. This also lessens the pressure on the median nerve. You may have this surgery if other types of treatment have not relieved your carpal tunnel symptoms. This surgery is usually done only for the hand that you use more often (dominant hand), but it may be done for both hands depending on your symptoms.  Tell a health care provider about:  · Any allergies you have.  · All medicines you are taking, including vitamins, herbs, eye drops, creams, and over-the-counter medicines.  · Any problems you or family members have had with anesthetic medicines.  · Any blood disorders you have.  · Any surgeries you have had.  · Any medical conditions you have.  · Whether you are pregnant or may be pregnant.  What are the risks?  Generally, this is a safe procedure. However, problems may occur, including:  · Infection.  · Bleeding.  · Injury to the median nerve.  · Allergic reactions to medicines.  · The surgery failing to relieve your symptoms, or making your symptoms worse.  What happens before the procedure?  Medicines  · Ask your health care provider about:  ? Changing or stopping your regular medicines. This is especially important if you are taking diabetes medicines or blood thinners.  ? Taking  medicines such as aspirin and ibuprofen. These medicines can thin your blood. Do not take these medicines unless your health care provider tells you to take them.  ? Taking over-the-counter medicines, vitamins, herbs, and supplements.  · You may be given antibiotic medicine to help prevent infection.  Staying hydrated  Follow instructions from your health care provider about hydration, which may include:  · Up to 2 hours before the procedure - you may continue to drink clear liquids, such as water, clear fruit juice, black coffee, and plain tea.  Eating and drinking restrictions  Follow instructions from your health care provider about eating and drinking, which may include:  · 8 hours before the procedure - stop eating heavy meals or foods such as meat, fried foods, or fatty foods.  · 6 hours before the procedure - stop eating light meals or foods, such as toast or cereal.  · 6 hours before the procedure - stop drinking milk or drinks that contain milk.  · 2 hours before the procedure - stop drinking clear liquids.  General instructions  · Ask your health care provider how your surgical site will be marked or identified.  · You may be asked to shower with a germ-killing soap.  · Plan to have someone take you home from the hospital or clinic.  · Plan to have a responsible adult care for you for at least 24 hours after you leave the hospital or clinic. This is important.  What happens   during the procedure?  · To lower your risk of infection:  ? Your health care team will wash or sanitize their hands.  ? Hair may be removed from the surgical area.  ? Your arm, hand, and wrist will be cleaned with a germ-killing (antiseptic) solution.  · An IV will be inserted into one of your veins.  · You will be given one of the following:  ? A medicine to numb the wrist area (local anesthetic). You may also be given a medicine to help you relax (sedative).  ? A medicine to make you fall asleep (general anesthetic).  · An incision  will be made in your wrist, on the same side as your palm.  · The skin of your wrist will be spread to expose the transverse carpal ligament.  · The transverse carpal ligament will be cut to make more room in the carpal tunnel space.  · Your incision will be closed with stitches (sutures) or staples.  · A bandage (dressing) will be placed over your wrist and wrapped around your hand and wrist.  The procedure may vary among health care providers and hospitals.  What happens after the procedure?  · Your blood pressure, heart rate, breathing rate, and blood oxygen level will be monitored until the medicines you were given have worn off.  · You will be given pain medicine as needed.  · A splint or brace may be placed over your dressing, to hold your hand and wrist in place while you heal.  · Do not drive until your health care provider approves.  Summary  · Carpal tunnel release is a surgery to relieve pain and numbness in the hand caused by swelling around a nerve (carpal tunnel syndrome).  · You may have this surgery if other types of treatment have not relieved your carpal tunnel symptoms.  · During carpal tunnel release surgery, a band of connective tissue (transverse carpal ligament) is cut to make more room in the carpal tunnel space.  This information is not intended to replace advice given to you by your health care provider. Make sure you discuss any questions you have with your health care provider.  Document Released: 12/25/2003 Document Revised: 06/13/2017 Document Reviewed: 06/13/2017  Elsevier Interactive Patient Education © 2019 [employer]

## 2019-04-11 NOTE — Progress Notes (Signed)
Patient ID: Emily Simmons, female   DOB: 1976/03/03, 43 y.o.   MRN: [id number]  Chief Complaint  Patient presents with  . Hand Pain    Bilat hand pain with left worse than right. Has had CTR on Rt hand approx 5 yrs ago.    HPI Emily Reasonya M Dor is a 43 y.o. female.  Presents for evaluation of bilateral carpal tunnel syndrome right carpal tunnel release 5 years ago now has recurrent symptoms in the right hand new symptoms in the left hand at least 2 months.  She wears braces at work.  She is an Midwifeinspector.  She is borderline diabetic she is also a smoker.  She has burning pain both hands severe duration as described constant worse with activity no other treatment.  Wakes her up at night she is holding her hands in the dependent position in the office today  Review of Systems Review of Systems  Cardiovascular: Positive for leg swelling.  Gastrointestinal: Negative for abdominal distention, abdominal pain, anal bleeding, blood in stool, constipation, diarrhea, nausea, rectal pain and vomiting.  Musculoskeletal: Positive for arthralgias and myalgias.  Neurological: Positive for weakness and numbness.  All other systems reviewed and are negative.    Past Medical History:  Diagnosis Date  . Arthritis    Right knee  . Chronic back pain 2008  . Diabetes mellitus without complication (HCC)   . GERD (gastroesophageal reflux disease)     Past Surgical History:  Procedure Laterality Date  . CARPAL TUNNEL RELEASE Right   . KNEE ARTHROSCOPY WITH LATERAL MENISECTOMY Right 05/26/2017   Procedure: KNEE ARTHROSCOPY WITH LATERAL MENISECTOMY;  Surgeon: Vickki HearingHarrison,  E, MD;  Location: [facility];  Service: Orthopedics;  Laterality: Right;  . KNEE SURGERY Right   . TUBAL LIGATION      Family History  Problem Relation Age of Onset  . Diabetes Mother   . Hypertension Mother   . Asthma Mother   . Diabetes Father   . Asthma Father   . Hypertension Sister      Social History   Tobacco Use  .  Smoking status: Current Every Day Smoker    Packs/day: 0.50    Types: Cigarettes  . Smokeless tobacco: Never Used  Substance Use Topics  . Alcohol use: Yes    Comment: occ  . Drug use: No    No Known Allergies  Current Outpatient Medications  Medication Sig Dispense Refill  . ibuprofen (ADVIL,MOTRIN) 200 MG tablet Take 3 tablets (600 mg total) by mouth every 6 (six) hours as needed for moderate pain. 90 tablet 5  . gabapentin (NEURONTIN) 100 MG capsule Take 1 capsule (100 mg total) by mouth 3 (three) times daily. 90 capsule 2   No current facility-administered medications for this visit.      Physical Exam BP (!) 142/100   Pulse 88   Temp (!) 97.5 F (36.4 C)   Ht 5\' 7"  (1.702 m)   Wt 232 lb (105.2 kg)   BMI 36.34 kg/m  Physical Exam The patient is well developed well nourished and well groomed.  Orientation to person place and time is normal  Mood is pleasant.  Ambulatory status normal gait and stance   Right and left upper extremity examination reveals the following:  Inspection reveals no swelling. There is tenderness over the carpal tunnel on the left nontender on the right  Range of motion of the wrist and elbow are normal  Motor exam shows mild weakness with grip strength.  Wrist joint is stable  Provocative tests for carpal tunnel Phalen's test positive Carpal tunnel compression test positive on the left negative on the right Tinel's test positive bilateral  Pulses are normal in the radial and ulnar artery with a normal Allen's test.  Decreased sensation is noted in the median nerve distribution.  And ulnar nerve distribution with negative Tinel's at the elbow  [place]  Carpal tunnel nerve conduction study: No nerve test will be done in this case    Encounter Diagnoses  Name Primary?  . Carpal tunnel syndrome of left wrist Yes  . Carpal tunnel syndrome of right wrist      PLAN:   Meds ordered this encounter   Medications  . gabapentin (NEURONTIN) 100 MG capsule    Sig: Take 1 capsule (100 mg total) by mouth 3 (three) times daily.    Dispense:  90 capsule    Refill:  2   Injection?  No MRI/CT/?  No  I placed her on gabapentin in preparation for surgery July 9  The procedure has been fully reviewed with the patient; The risks and benefits of surgery have been discussed and explained and understood. Alternative treatment has also been reviewed, questions were encouraged and answered. The postoperative plan is also been reviewed.  We discussed that the small and ring finger may not get better after surgery  Smoking cessation education.  Patient made aware that if she does not control her diabetes does not stop smoking she will probably have recurrent symptoms

## 2019-04-16 NOTE — Patient Instructions (Signed)
Emily Simmons  04/16/2019     @PREFPERIOPPHARMACY @   Your procedure is scheduled on  04/26/2019 .  Report to [facility] at  1035  A.M.  Call this number if you have problems the morning of surgery:  [phone]   Remember:  Do not eat or drink after midnight.                         Take these medicines the morning of surgery with A SIP OF WATER  gabapentin    Do not wear jewelry, make-up or nail polish.  Do not wear lotions, powders, or perfumes, or deodorant.  Do not shave 48 hours prior to surgery.  Men may shave face and neck.  Do not bring valuables to the hospital.  [facility] is not responsible for any belongings or valuables.  Contacts, dentures or bridgework may not be worn into surgery.  Leave your suitcase in the car.  After surgery it may be brought to your room.  For patients admitted to the hospital, discharge time will be determined by your treatment team.  Patients discharged the day of surgery will not be allowed to drive home.   Name and phone number of your driver:   family Special instructions:  None  Please read over the following fact sheets that you were given. Anesthesia Post-op Instructions and Care and Recovery After Surgery       Open Carpal Tunnel Release, Care After This sheet gives you information about how to care for yourself after your procedure. Your health care provider may also give you more specific instructions. If you have problems or questions, contact your health care provider. What can I expect after the procedure? After the procedure, it is common to have:  Wrist stiffness.  Bruising. Follow these instructions at home: Bathing  Do not take baths, swim, or use a hot tub until your health care provider approves. Ask your health care provider if you may take showers.  Keep your bandage (dressing) dry until your health care provider says it can be removed. If you have a splint or brace:  Wear the splint or  brace as told by your health care provider. You may need to wear it for 2-3 weeks. Remove it only as told by your health care provider.  Loosen the splint or brace if your fingers tingle, become numb, or turn cold and blue.  Keep the splint or brace clean.  If the splint or brace is not waterproof: ? Do not let it get wet. ? Cover it with a watertight covering when you take a bath or a shower. Incision care   Follow instructions from your health care provider about how to take care of your incision. Make sure you: ? Wash your hands with soap and water before you change your dressing. If soap and water are not available, use hand sanitizer. ? Change your dressing as told by your health care provider. ? Leave stitches (sutures), skin glue, or adhesive strips in place. These skin closures may need to stay in place for 2 weeks or longer. If adhesive strip edges start to loosen and curl up, you may trim the loose edges. Do not remove adhesive strips completely unless your health care provider tells you to do that.  Check your incision area every day for signs of infection. Check for: ? Redness, swelling, or pain. ? Fluid or blood. ?  Warmth. ? Pus or a bad smell. Managing pain, stiffness, and swelling   If directed, put ice on the affected area. ? If you have a removable splint or brace, remove it as told by your health care provider. ? Put ice in a plastic bag. ? Place a towel between your skin and the bag. ? Leave the ice on for 20 minutes, 2-3 times a day.  Move your fingers often to avoid stiffness and to lessen swelling.  Raise (elevate) your wrist above the level of your heart while you are sitting or lying down. Activity  Do not drive until your health care provider approves.  Do not drive or use heavy machinery while taking prescription pain medicine.  Return to your normal activities as told by your health care provider. Avoid activities that cause pain.  If physical  therapy was prescribed, do exercises as told by your therapist. Physical therapy can help you heal faster and regain movement. General instructions  Take over-the-counter and prescription medicines only as told by your health care provider.  If you are taking prescription pain medicine, take actions to prevent or treat constipation. Your health care provider may recommend that you: ? Drink enough fluid to keep your urine pale yellow. ? Eat foods that are high in fiber, such as fresh fruits and vegetables, whole grains, and beans. ? Limit foods that are high in fat and processed sugars, such as fried or sweet foods. ? Take an over-the-counter or prescription medicine for constipation.  Do not use any products that contain nicotine or tobacco, such as cigarettes and e-cigarettes. If you need help quitting, ask your health care provider.  Keep all follow-up visits as told by your health care provider and physical therapist. This is important. Contact a health care provider if:  You have redness or swelling around your incision.  You have fluid or blood coming from your incision.  Your incision feels warm to the touch.  You have pus or a bad smell coming from your incision.  You have a fever.  You have chills.  You have pain that does not get better with medicine.  Your carpal tunnel symptoms do not go away after 2 months.  Your carpal tunnel symptoms go away and then come back. Get help right away if:  You have pain or numbness that is getting worse.  Your fingers or fingertips become very pale or bluish in color.  You are not able to move your fingers. Summary  It is common to have wrist stiffness and bruising after a carpal tunnel release.  Icing and raising (elevating) your wrist may help to lessen swelling and pain.  Call your health care provider if you have a fever or notice any signs of infection in your incision area. This information is not intended to replace  advice given to you by your health care provider. Make sure you discuss any questions you have with your health care provider. Document Released: 04/23/2005 Document Revised: 09/16/2017 Document Reviewed: 06/13/2017 Elsevier Patient Education  2020 [employer]  Monitored Anesthesia Care, Care After These instructions provide you with information about caring for yourself after your procedure. Your health care provider may also give you more specific instructions. Your treatment has been planned according to current medical practices, but problems sometimes occur. Call your health care provider if you have any problems or questions after your procedure. What can I expect after the procedure? After your procedure, you may:  Feel sleepy for several hours.  Feel clumsy and have poor balance for several hours.  Feel forgetful about what happened after the procedure.  Have poor judgment for several hours.  Feel nauseous or vomit.  Have a sore throat if you had a breathing tube during the procedure. Follow these instructions at home: For at least 24 hours after the procedure:      Have a responsible adult stay with you. It is important to have someone help care for you until you are awake and alert.  Rest as needed.  Do not: ? Participate in activities in which you could fall or become injured. ? Drive. ? Use heavy machinery. ? Drink alcohol. ? Take sleeping pills or medicines that cause drowsiness. ? Make important decisions or sign legal documents. ? Take care of children on your own. Eating and drinking  Follow the diet that is recommended by your health care provider.  If you vomit, drink water, juice, or soup when you can drink without vomiting.  Make sure you have little or no nausea before eating solid foods. General instructions  Take over-the-counter and prescription medicines only as told by your health care provider.  If you have sleep apnea, surgery and  certain medicines can increase your risk for breathing problems. Follow instructions from your health care provider about wearing your sleep device: ? Anytime you are sleeping, including during daytime naps. ? While taking prescription pain medicines, sleeping medicines, or medicines that make you drowsy.  If you smoke, do not smoke without supervision.  Keep all follow-up visits as told by your health care provider. This is important. Contact a health care provider if:  You keep feeling nauseous or you keep vomiting.  You feel light-headed.  You develop a rash.  You have a fever. Get help right away if:  You have trouble breathing. Summary  For several hours after your procedure, you may feel sleepy and have poor judgment.  Have a responsible adult stay with you for at least 24 hours or until you are awake and alert. This information is not intended to replace advice given to you by your health care provider. Make sure you discuss any questions you have with your health care provider. Document Released: 01/25/2016 Document Revised: 01/02/2018 Document Reviewed: 01/25/2016 Elsevier Patient Education  2020 [employer] How to Use Chlorhexidine for Bathing Chlorhexidine gluconate (CHG) is a germ-killing (antiseptic) solution that is used to clean the skin. It can get rid of the bacteria that normally live on the skin and can keep them away for about 24 hours. To clean your skin with CHG, you may be given:  A CHG solution to use in the shower or as part of a sponge bath.  A prepackaged cloth that contains CHG. Cleaning your skin with CHG may help lower the risk for infection:  While you are staying in the intensive care unit of the hospital.  If you have a vascular access, such as a central line, to provide short-term or long-term access to your veins.  If you have a catheter to drain urine from your bladder.  If you are on a ventilator. A ventilator is a machine that helps  you breathe by moving air in and out of your lungs.  After surgery. What are the risks? Risks of using CHG include:  A skin reaction.  Hearing loss, if CHG gets in your ears.  Eye injury, if CHG gets in your eyes and is not rinsed out.  The CHG product catching fire. Make  sure that you avoid smoking and flames after applying CHG to your skin. Do not use CHG:  If you have a chlorhexidine allergy or have previously reacted to chlorhexidine.  On babies younger than 462 months of age. How to use CHG solution  Use CHG only as told by your health care provider, and follow the instructions on the label.  Use the full amount of CHG as directed. Usually, this is one bottle. During a shower Follow these steps when using CHG solution during a shower (unless your health care provider gives you different instructions): 1. Start the shower. 2. Use your normal soap and shampoo to wash your face and hair. 3. Turn off the shower or move out of the shower stream. 4. Pour the CHG onto a clean washcloth. Do not use any type of brush or rough-edged sponge. 5. Starting at your neck, lather your body down to your toes. Make sure you follow these instructions: ? If you will be having surgery, pay special attention to the part of your body where you will be having surgery. Scrub this area for at least 1 minute. ? Do not use CHG on your head or face. If the solution gets into your ears or eyes, rinse them well with water. ? Avoid your genital area. ? Avoid any areas of skin that have broken skin, cuts, or scrapes. ? Scrub your back and under your arms. Make sure to wash skin folds. 6. Let the lather sit on your skin for 1-2 minutes or as long as told by your health care provider. 7. Thoroughly rinse your entire body in the shower. Make sure that all body creases and crevices are rinsed well. 8. Dry off with a clean towel. Do not put any substances on your body afterward-such as powder, lotion, or  perfume-unless you are told to do so by your health care provider. Only use lotions that are recommended by the manufacturer. 9. Put on clean clothes or pajamas. 10. If it is the night before your surgery, sleep in clean sheets.  During a sponge bath Follow these steps when using CHG solution during a sponge bath (unless your health care provider gives you different instructions): 1. Use your normal soap and shampoo to wash your face and hair. 2. Pour the CHG onto a clean washcloth. 3. Starting at your neck, lather your body down to your toes. Make sure you follow these instructions: ? If you will be having surgery, pay special attention to the part of your body where you will be having surgery. Scrub this area for at least 1 minute. ? Do not use CHG on your head or face. If the solution gets into your ears or eyes, rinse them well with water. ? Avoid your genital area. ? Avoid any areas of skin that have broken skin, cuts, or scrapes. ? Scrub your back and under your arms. Make sure to wash skin folds. 4. Let the lather sit on your skin for 1-2 minutes or as long as told by your health care provider. 5. Using a different clean, wet washcloth, thoroughly rinse your entire body. Make sure that all body creases and crevices are rinsed well. 6. Dry off with a clean towel. Do not put any substances on your body afterward-such as powder, lotion, or perfume-unless you are told to do so by your health care provider. Only use lotions that are recommended by the manufacturer. 7. Put on clean clothes or pajamas. 8. If it is the night  before your surgery, sleep in clean sheets. How to use CHG prepackaged cloths  Only use CHG cloths as told by your health care provider, and follow the instructions on the label.  Use the CHG cloth on clean, dry skin.  Do not use the CHG cloth on your head or face unless your health care provider tells you to.  When washing with the CHG cloth: ? Avoid your genital  area. ? Avoid any areas of skin that have broken skin, cuts, or scrapes. Before surgery Follow these steps when using a CHG cloth to clean before surgery (unless your health care provider gives you different instructions): 1. Using the CHG cloth, vigorously scrub the part of your body where you will be having surgery. Scrub using a back-and-forth motion for 3 minutes. The area on your body should be completely wet with CHG when you are done scrubbing. 2. Do not rinse. Discard the cloth and let the area air-dry. Do not put any substances on the area afterward, such as powder, lotion, or perfume. 3. Put on clean clothes or pajamas. 4. If it is the night before your surgery, sleep in clean sheets.  For general bathing Follow these steps when using CHG cloths for general bathing (unless your health care provider gives you different instructions). 1. Use a separate CHG cloth for each area of your body. Make sure you wash between any folds of skin and between your fingers and toes. Wash your body in the following order, switching to a new cloth after each step: ? The front of your neck, shoulders, and chest. ? Both of your arms, under your arms, and your hands. ? Your stomach and groin area, avoiding the genitals. ? Your right leg and foot. ? Your left leg and foot. ? The back of your neck, your back, and your buttocks. 2. Do not rinse. Discard the cloth and let the area air-dry. Do not put any substances on your body afterward-such as powder, lotion, or perfume-unless you are told to do so by your health care provider. Only use lotions that are recommended by the manufacturer. 3. Put on clean clothes or pajamas. Contact a health care provider if:  Your skin gets irritated after scrubbing.  You have questions about using your solution or cloth. Get help right away if:  Your eyes become very red or swollen.  Your eyes itch badly.  Your skin itches badly and is red or swollen.  Your hearing  changes.  You have trouble seeing.  You have swelling or tingling in your mouth or throat.  You have trouble breathing.  You swallow any chlorhexidine. Summary  Chlorhexidine gluconate (CHG) is a germ-killing (antiseptic) solution that is used to clean the skin. Cleaning your skin with CHG may help to lower your risk for infection.  You may be given CHG to use for bathing. It may be in a bottle or in a prepackaged cloth to use on your skin. Carefully follow your health care provider's instructions and the instructions on the product label.  Do not use CHG if you have a chlorhexidine allergy.  Contact your health care provider if your skin gets irritated after scrubbing. This information is not intended to replace advice given to you by your health care provider. Make sure you discuss any questions you have with your health care provider. Document Released: 06/28/2012 Document Revised: 12/21/2018 Document Reviewed: 09/01/2017 Elsevier Patient Education  2020 [employer].

## 2019-04-23 ENCOUNTER — Other Ambulatory Visit: Payer: Self-pay

## 2019-04-23 ENCOUNTER — Other Ambulatory Visit (HOSPITAL_COMMUNITY)
Admission: RE | Admit: 2019-04-23 | Discharge: 2019-04-23 | Disposition: A | Discharge status: Home / Self Care | Payer: BC Managed Care – PPO | Source: Ambulatory Visit | Attending: Orthopedic Surgery | Admitting: Orthopedic Surgery

## 2019-04-23 ENCOUNTER — Encounter (HOSPITAL_COMMUNITY)
Admission: RE | Admit: 2019-04-23 | Discharge: 2019-04-23 | Disposition: A | Discharge status: Home / Self Care | Payer: BC Managed Care – PPO | Source: Ambulatory Visit | Attending: Orthopedic Surgery | Admitting: Orthopedic Surgery

## 2019-04-23 DIAGNOSIS — G5602 Carpal tunnel syndrome, left upper limb: Secondary | ICD-10-CM | POA: Diagnosis not present

## 2019-04-23 DIAGNOSIS — Z01812 Encounter for preprocedural laboratory examination: Secondary | ICD-10-CM | POA: Diagnosis not present

## 2019-04-23 DIAGNOSIS — Z1159 Encounter for screening for other viral diseases: Secondary | ICD-10-CM | POA: Insufficient documentation

## 2019-04-23 LAB — CBC WITH DIFFERENTIAL/PLATELET
Abs Immature Granulocytes: 0.05 10*3/uL (ref 0.00–0.07)
Basophils Absolute: 0.1 10*3/uL (ref 0.0–0.1)
Basophils Relative: 1 %
Eosinophils Absolute: 0.4 10*3/uL (ref 0.0–0.5)
Eosinophils Relative: 3 %
HCT: 38.2 % (ref 36.0–46.0)
Hemoglobin: 12.2 g/dL (ref 12.0–15.0)
Immature Granulocytes: 1 %
Lymphocytes Relative: 26 %
Lymphs Abs: 2.8 10*3/uL (ref 0.7–4.0)
MCH: 26.5 pg (ref 26.0–34.0)
MCHC: 31.9 g/dL (ref 30.0–36.0)
MCV: 83 fL (ref 80.0–100.0)
Monocytes Absolute: 0.7 10*3/uL (ref 0.1–1.0)
Monocytes Relative: 6 %
Neutro Abs: 6.8 10*3/uL (ref 1.7–7.7)
Neutrophils Relative %: 63 %
Platelets: 258 10*3/uL (ref 150–400)
RBC: 4.6 MIL/uL (ref 3.87–5.11)
RDW: 14.8 % (ref 11.5–15.5)
WBC: 10.7 10*3/uL — ABNORMAL HIGH (ref 4.0–10.5)
nRBC: 0 % (ref 0.0–0.2)

## 2019-04-23 LAB — BASIC METABOLIC PANEL
Anion gap: 12 (ref 5–15)
BUN: 12 mg/dL (ref 6–20)
CO2: 21 mmol/L — ABNORMAL LOW (ref 22–32)
Calcium: 8.6 mg/dL — ABNORMAL LOW (ref 8.9–10.3)
Chloride: 102 mmol/L (ref 98–111)
Creatinine, Ser: 0.68 mg/dL (ref 0.44–1.00)
GFR calc Af Amer: >60 mL/min (ref >60)
GFR calc non Af Amer: >60 mL/min (ref >60)
Glucose, Bld: 153 mg/dL — ABNORMAL HIGH (ref 70–99)
Potassium: 3.6 mmol/L (ref 3.5–5.1)
Sodium: 135 mmol/L (ref 135–145)

## 2019-04-23 LAB — HCG, SERUM, QUALITATIVE: Preg, Serum: NEGATIVE

## 2019-04-24 LAB — SARS CORONAVIRUS 2 (TAT 6-24 HRS)

## 2019-04-25 ENCOUNTER — Other Ambulatory Visit (HOSPITAL_COMMUNITY)
Admission: RE | Admit: 2019-04-25 | Discharge: 2019-04-25 | Disposition: A | Discharge status: Home / Self Care | Payer: BC Managed Care – PPO | Source: Ambulatory Visit | Attending: Orthopedic Surgery | Admitting: Orthopedic Surgery

## 2019-04-25 ENCOUNTER — Other Ambulatory Visit: Payer: Self-pay

## 2019-04-25 DIAGNOSIS — E119 Type 2 diabetes mellitus without complications: Secondary | ICD-10-CM | POA: Diagnosis not present

## 2019-04-25 DIAGNOSIS — M1711 Unilateral primary osteoarthritis, right knee: Secondary | ICD-10-CM | POA: Diagnosis not present

## 2019-04-25 DIAGNOSIS — Z79899 Other long term (current) drug therapy: Secondary | ICD-10-CM | POA: Diagnosis not present

## 2019-04-25 DIAGNOSIS — F17210 Nicotine dependence, cigarettes, uncomplicated: Secondary | ICD-10-CM | POA: Diagnosis not present

## 2019-04-25 DIAGNOSIS — G8929 Other chronic pain: Secondary | ICD-10-CM | POA: Diagnosis not present

## 2019-04-25 DIAGNOSIS — Z825 Family history of asthma and other chronic lower respiratory diseases: Secondary | ICD-10-CM | POA: Diagnosis not present

## 2019-04-25 DIAGNOSIS — Z833 Family history of diabetes mellitus: Secondary | ICD-10-CM | POA: Diagnosis not present

## 2019-04-25 DIAGNOSIS — Z8249 Family history of ischemic heart disease and other diseases of the circulatory system: Secondary | ICD-10-CM | POA: Diagnosis not present

## 2019-04-25 DIAGNOSIS — M549 Dorsalgia, unspecified: Secondary | ICD-10-CM | POA: Diagnosis not present

## 2019-04-25 DIAGNOSIS — Z791 Long term (current) use of non-steroidal anti-inflammatories (NSAID): Secondary | ICD-10-CM | POA: Diagnosis not present

## 2019-04-25 DIAGNOSIS — K219 Gastro-esophageal reflux disease without esophagitis: Secondary | ICD-10-CM | POA: Diagnosis not present

## 2019-04-25 DIAGNOSIS — M1990 Unspecified osteoarthritis, unspecified site: Secondary | ICD-10-CM | POA: Diagnosis not present

## 2019-04-25 DIAGNOSIS — G5603 Carpal tunnel syndrome, bilateral upper limbs: Secondary | ICD-10-CM | POA: Diagnosis present

## 2019-04-25 DIAGNOSIS — Z1159 Encounter for screening for other viral diseases: Secondary | ICD-10-CM | POA: Diagnosis not present

## 2019-04-25 LAB — SARS CORONAVIRUS 2 (TAT 6-24 HRS): SARS Coronavirus 2: NEGATIVE

## 2019-04-25 NOTE — H&P (Signed)
Chief Complaint  Patient presents with  . Hand Pain      Bilat hand pain with left worse than right. Has had CTR on Rt hand approx 5 yrs ago.     HPI Emily Simmons is a 43 y.o. female.  Presents for evaluation of bilateral carpal tunnel syndrome right carpal tunnel release 5 years ago now has recurrent symptoms in the right hand new symptoms in the left hand at least 2 months.  She wears braces at work.  She is an Midwifeinspector.  She is borderline diabetic she is also a smoker.   She has burning pain both hands severe duration as described constant worse with activity no other treatment.  Wakes her up at night she is holding her hands in the dependent position in the office today   Review of Systems Review of Systems  Cardiovascular: Positive for leg swelling.  Gastrointestinal: Negative for abdominal distention, abdominal pain, anal bleeding, blood in stool, constipation, diarrhea, nausea, rectal pain and vomiting.  Musculoskeletal: Positive for arthralgias and myalgias.  Neurological: Positive for weakness and numbness.  All other systems reviewed and are negative.           Past Medical History:  Diagnosis Date  . Arthritis      Right knee  . Chronic back pain 2008  . Diabetes mellitus without complication (HCC)    . GERD (gastroesophageal reflux disease)            Past Surgical History:  Procedure Laterality Date  . CARPAL TUNNEL RELEASE Right    . KNEE ARTHROSCOPY WITH LATERAL MENISECTOMY Right 05/26/2017    Procedure: KNEE ARTHROSCOPY WITH LATERAL MENISECTOMY;  Surgeon: Vickki HearingHarrison, Stanley E, MD;  Location: [facility];  Service: Orthopedics;  Laterality: Right;  . KNEE SURGERY Right    . TUBAL LIGATION              Family History  Problem Relation Age of Onset  . Diabetes Mother    . Hypertension Mother    . Asthma Mother    . Diabetes Father    . Asthma Father    . Hypertension Sister         Social History         Tobacco Use  . Smoking status: Current Every Day  Smoker      Packs/day: 0.50      Types: Cigarettes  . Smokeless tobacco: Never Used  Substance Use Topics  . Alcohol use: Yes      Comment: occ  . Drug use: No     No Known Allergies         Current Outpatient Medications  Medication Sig Dispense Refill  . ibuprofen (ADVIL,MOTRIN) 200 MG tablet Take 3 tablets (600 mg total) by mouth every 6 (six) hours as needed for moderate pain. 90 tablet 5  . gabapentin (NEURONTIN) 100 MG capsule Take 1 capsule (100 mg total) by mouth 3 (three) times daily. 90 capsule 2    No current facility-administered medications for this visit.        Physical Exam BP (!) 142/100   Pulse 88   Temp (!) 97.5 F (36.4 C)   Ht 5\' 7"  (1.702 m)   Wt 232 lb (105.2 kg)   BMI 36.34 kg/m  Physical Exam The patient is well developed well nourished and well groomed.  Orientation to person place and time is normal  Mood is pleasant.   Ambulatory status normal gait and stance  Right and left upper extremity examination reveals the following:   Inspection reveals no swelling. There is tenderness over the carpal tunnel on the left nontender on the right   Range of motion of the wrist and elbow are normal   Motor exam shows mild weakness with grip strength.   Wrist joint is stable   Provocative tests for carpal tunnel Phalen's test positive Carpal tunnel compression test positive on the left negative on the right Tinel's test positive bilateral   Pulses are normal in the radial and ulnar artery with a normal Allen's test.   Decreased sensation is noted in the median nerve distribution.  And ulnar nerve distribution with negative Tinel's at the elbow   [place]  Carpal tunnel nerve conduction study: No nerve test will be done in this case           Encounter Diagnoses  Name Primary?  . Carpal tunnel syndrome of left wrist Yes  . Carpal tunnel syndrome of right wrist         PLAN:   Plan to do the left  carpal tunnel release first   The procedure has been fully reviewed with the patient; The risks and benefits of surgery have been discussed and explained and understood. Alternative treatment has also been reviewed, questions were encouraged and answered. The postoperative plan is also been reviewed.   We discussed that the small and ring finger may not get better after surgery   Smoking cessation education.  Patient made aware that if she does not control her diabetes does not stop smoking she will probably have recurrent symptoms

## 2019-04-26 ENCOUNTER — Ambulatory Visit (HOSPITAL_COMMUNITY)
Admission: RE | Admit: 2019-04-26 | Discharge: 2019-04-26 | Disposition: A | Discharge status: Home / Self Care | Payer: BC Managed Care – PPO | Attending: Orthopedic Surgery | Admitting: Orthopedic Surgery

## 2019-04-26 ENCOUNTER — Encounter (HOSPITAL_COMMUNITY)
Admission: RE | Disposition: A | Discharge status: Home / Self Care | Payer: Self-pay | Source: Home / Self Care | Attending: Orthopedic Surgery

## 2019-04-26 ENCOUNTER — Ambulatory Visit (HOSPITAL_COMMUNITY): Payer: BC Managed Care – PPO | Admitting: Anesthesiology

## 2019-04-26 ENCOUNTER — Encounter (HOSPITAL_COMMUNITY): Payer: Self-pay | Admitting: *Deleted

## 2019-04-26 DIAGNOSIS — G5603 Carpal tunnel syndrome, bilateral upper limbs: Secondary | ICD-10-CM | POA: Diagnosis not present

## 2019-04-26 DIAGNOSIS — M549 Dorsalgia, unspecified: Secondary | ICD-10-CM | POA: Insufficient documentation

## 2019-04-26 DIAGNOSIS — Z1159 Encounter for screening for other viral diseases: Secondary | ICD-10-CM | POA: Insufficient documentation

## 2019-04-26 DIAGNOSIS — Z8249 Family history of ischemic heart disease and other diseases of the circulatory system: Secondary | ICD-10-CM | POA: Insufficient documentation

## 2019-04-26 DIAGNOSIS — G5602 Carpal tunnel syndrome, left upper limb: Secondary | ICD-10-CM | POA: Diagnosis not present

## 2019-04-26 DIAGNOSIS — M1990 Unspecified osteoarthritis, unspecified site: Secondary | ICD-10-CM | POA: Insufficient documentation

## 2019-04-26 DIAGNOSIS — G8929 Other chronic pain: Secondary | ICD-10-CM | POA: Insufficient documentation

## 2019-04-26 DIAGNOSIS — Z79899 Other long term (current) drug therapy: Secondary | ICD-10-CM | POA: Insufficient documentation

## 2019-04-26 DIAGNOSIS — E119 Type 2 diabetes mellitus without complications: Secondary | ICD-10-CM | POA: Insufficient documentation

## 2019-04-26 DIAGNOSIS — Z791 Long term (current) use of non-steroidal anti-inflammatories (NSAID): Secondary | ICD-10-CM | POA: Insufficient documentation

## 2019-04-26 DIAGNOSIS — Z825 Family history of asthma and other chronic lower respiratory diseases: Secondary | ICD-10-CM | POA: Insufficient documentation

## 2019-04-26 DIAGNOSIS — Z833 Family history of diabetes mellitus: Secondary | ICD-10-CM | POA: Insufficient documentation

## 2019-04-26 DIAGNOSIS — F17210 Nicotine dependence, cigarettes, uncomplicated: Secondary | ICD-10-CM | POA: Insufficient documentation

## 2019-04-26 DIAGNOSIS — K219 Gastro-esophageal reflux disease without esophagitis: Secondary | ICD-10-CM | POA: Insufficient documentation

## 2019-04-26 DIAGNOSIS — M1711 Unilateral primary osteoarthritis, right knee: Secondary | ICD-10-CM | POA: Insufficient documentation

## 2019-04-26 HISTORY — PX: CARPAL TUNNEL RELEASE: SHX101

## 2019-04-26 LAB — GLUCOSE, CAPILLARY: Glucose-Capillary: 116 mg/dL — ABNORMAL HIGH (ref 70–99)

## 2019-04-26 SURGERY — CARPAL TUNNEL RELEASE
Anesthesia: Regional | Laterality: Left

## 2019-04-26 MED ORDER — CEFAZOLIN SODIUM-DEXTROSE 2-4 GM/100ML-% IV SOLN
2.0000 g | INTRAVENOUS | Status: AC
  Administered 2019-04-26: 2 g via INTRAVENOUS

## 2019-04-26 MED ORDER — FENTANYL CITRATE (PF) 100 MCG/2ML IJ SOLN
INTRAMUSCULAR | Status: DC | PRN
  Administered 2019-04-26 (×2): 50 ug via INTRAVENOUS

## 2019-04-26 MED ORDER — HYDROCODONE-ACETAMINOPHEN 5-325 MG PO TABS
1.0000 | ORAL_TABLET | Freq: Once | ORAL | Status: AC
  Administered 2019-04-26: 1 via ORAL
  Filled 2019-04-26: qty 1

## 2019-04-26 MED ORDER — DEXAMETHASONE SODIUM PHOSPHATE 10 MG/ML IJ SOLN
INTRAMUSCULAR | Status: AC
  Filled 2019-04-26: qty 1

## 2019-04-26 MED ORDER — MIDAZOLAM HCL 2 MG/2ML IJ SOLN
INTRAMUSCULAR | Status: AC
  Filled 2019-04-26: qty 2

## 2019-04-26 MED ORDER — LIDOCAINE HCL (PF) 0.5 % IJ SOLN
INTRAMUSCULAR | Status: AC
  Filled 2019-04-26: qty 50

## 2019-04-26 MED ORDER — DEXAMETHASONE SODIUM PHOSPHATE 10 MG/ML IJ SOLN
INTRAMUSCULAR | Status: DC | PRN
  Administered 2019-04-26: 8 mg via INTRAVENOUS

## 2019-04-26 MED ORDER — HYDROCODONE-ACETAMINOPHEN 5-325 MG PO TABS: 1.0000 | ORAL_TABLET | ORAL | 0 refills | Status: AC | PRN

## 2019-04-26 MED ORDER — BUPIVACAINE HCL (PF) 0.5 % IJ SOLN
INTRAMUSCULAR | Status: AC
  Filled 2019-04-26: qty 30

## 2019-04-26 MED ORDER — BUPIVACAINE HCL (PF) 0.5 % IJ SOLN
INTRAMUSCULAR | Status: DC | PRN
  Administered 2019-04-26: 10 mL

## 2019-04-26 MED ORDER — PROPOFOL 10 MG/ML IV BOLUS
INTRAVENOUS | Status: AC
  Filled 2019-04-26: qty 20

## 2019-04-26 MED ORDER — CHLORHEXIDINE GLUCONATE 4 % EX LIQD: 60.0000 mL | Freq: Once | CUTANEOUS | Status: DC

## 2019-04-26 MED ORDER — 0.9 % SODIUM CHLORIDE (POUR BTL) OPTIME
TOPICAL | Status: DC | PRN
  Administered 2019-04-26: 1000 mL

## 2019-04-26 MED ORDER — MIDAZOLAM HCL 5 MG/5ML IJ SOLN
INTRAMUSCULAR | Status: DC | PRN
  Administered 2019-04-26 (×2): 2 mg via INTRAVENOUS

## 2019-04-26 MED ORDER — FENTANYL CITRATE (PF) 100 MCG/2ML IJ SOLN
INTRAMUSCULAR | Status: AC
  Filled 2019-04-26: qty 2

## 2019-04-26 MED ORDER — PROPOFOL 500 MG/50ML IV EMUL
INTRAVENOUS | Status: DC | PRN
  Administered 2019-04-26: 55 ug/kg/min via INTRAVENOUS

## 2019-04-26 MED ORDER — LIDOCAINE HCL (PF) 0.5 % IJ SOLN
INTRAMUSCULAR | Status: DC | PRN
  Administered 2019-04-26: 250 mg via INTRAVENOUS

## 2019-04-26 MED ORDER — CEFAZOLIN SODIUM-DEXTROSE 2-4 GM/100ML-% IV SOLN
INTRAVENOUS | Status: AC
  Filled 2019-04-26: qty 100

## 2019-04-26 SURGICAL SUPPLY — 43 items
APL PRP STRL LF DISP 70% ISPRP (MISCELLANEOUS) ×1
BANDAGE ELASTIC 3 LF NS (GAUZE/BANDAGES/DRESSINGS) ×3 IMPLANT
BANDAGE ESMARK 4X12 BL STRL LF (DISPOSABLE) ×1 IMPLANT
BLADE SURG 15 STRL LF DISP TIS (BLADE) ×1 IMPLANT
BLADE SURG 15 STRL SS (BLADE) ×3
BNDG CMPR 12X4 ELC STRL LF (DISPOSABLE) ×1
BNDG CMPR MED 5X3 ELC HKLP NS (GAUZE/BANDAGES/DRESSINGS) ×1
BNDG COHESIVE 4X5 TAN STRL (GAUZE/BANDAGES/DRESSINGS) ×3 IMPLANT
BNDG ESMARK 4X12 BLUE STRL LF (DISPOSABLE) ×3
BNDG GAUZE ELAST 4 BULKY (GAUZE/BANDAGES/DRESSINGS) ×3 IMPLANT
CHLORAPREP W/TINT 26 (MISCELLANEOUS) ×3 IMPLANT
CLOTH BEACON ORANGE TIMEOUT ST (SAFETY) ×3 IMPLANT
COVER LIGHT HANDLE STERIS (MISCELLANEOUS) ×6 IMPLANT
COVER WAND RF STERILE (DRAPES) ×3 IMPLANT
CUFF TOURN SGL QUICK 18X4 (TOURNIQUET CUFF) ×3 IMPLANT
DECANTER SPIKE VIAL GLASS SM (MISCELLANEOUS) ×3 IMPLANT
DRAPE PROXIMA HALF (DRAPES) ×3 IMPLANT
DRSG XEROFORM 1X8 (GAUZE/BANDAGES/DRESSINGS) ×3 IMPLANT
ELECT NDL TIP 2.8 STRL (NEEDLE) ×1 IMPLANT
ELECT NEEDLE TIP 2.8 STRL (NEEDLE) ×3 IMPLANT
ELECT REM PT RETURN 9FT ADLT (ELECTROSURGICAL) ×3
ELECTRODE REM PT RTRN 9FT ADLT (ELECTROSURGICAL) ×1 IMPLANT
GAUZE SPONGE 4X4 12PLY STRL (GAUZE/BANDAGES/DRESSINGS) ×3 IMPLANT
GLOVE BIOGEL M 7.0 STRL (GLOVE) ×2 IMPLANT
GLOVE BIOGEL PI IND STRL 7.0 (GLOVE) ×1 IMPLANT
GLOVE BIOGEL PI INDICATOR 7.0 (GLOVE) ×2
GLOVE SKINSENSE NS SZ8.0 LF (GLOVE) ×2
GLOVE SKINSENSE STRL SZ8.0 LF (GLOVE) ×1 IMPLANT
GLOVE SS N UNI LF 8.5 STRL (GLOVE) ×3 IMPLANT
GOWN STRL REUS W/ TWL LRG LVL3 (GOWN DISPOSABLE) ×1 IMPLANT
GOWN STRL REUS W/TWL LRG LVL3 (GOWN DISPOSABLE) ×3
GOWN STRL REUS W/TWL XL LVL3 (GOWN DISPOSABLE) ×3 IMPLANT
KIT TURNOVER KIT A (KITS) ×3 IMPLANT
MANIFOLD NEPTUNE II (INSTRUMENTS) ×3 IMPLANT
NDL HYPO 21X1.5 SAFETY (NEEDLE) ×1 IMPLANT
NEEDLE HYPO 21X1.5 SAFETY (NEEDLE) ×3 IMPLANT
NS IRRIG 1000ML POUR BTL (IV SOLUTION) ×3 IMPLANT
PACK BASIC LIMB (CUSTOM PROCEDURE TRAY) ×3 IMPLANT
PAD ARMBOARD 7.5X6 YLW CONV (MISCELLANEOUS) ×3 IMPLANT
POSITIONER HAND ALUMI XLG (MISCELLANEOUS) ×3 IMPLANT
SET BASIN LINEN APH (SET/KITS/TRAYS/PACK) ×3 IMPLANT
SUT ETHILON 3 0 FSL (SUTURE) ×3 IMPLANT
SYR CONTROL 10ML LL (SYRINGE) ×3 IMPLANT

## 2019-04-26 NOTE — Interval H&P Note (Signed)
History and Physical Interval Note:  04/26/2019 10:15 AM  Emily Simmons  has presented today for surgery, with the diagnosis of left carpal tunnel syndrome.  The various methods of treatment have been discussed with the patient and family. After consideration of risks, benefits and other options for treatment, the patient has consented to  Procedure(s): LEFT CARPAL TUNNEL RELEASE (Left) as a surgical intervention.  The patient's history has been reviewed, patient examined, no change in status, stable for surgery.  I have reviewed the patient's chart and labs.  Questions were answered to the patient's satisfaction.     Arther Abbott

## 2019-04-26 NOTE — Op Note (Signed)
04/26/2019  11:00 AM  PATIENT:  Emily Simmons  43 y.o. female  PRE-OPERATIVE DIAGNOSIS:  left carpal tunnel syndrome  POST-OPERATIVE DIAGNOSIS:  left carpal tunnel syndrome//[id number]  PROCEDURE:  Procedure(s): LEFT CARPAL TUNNEL RELEASE (Left)  SURGEON:  Surgeon(s) and Role:    Carole Civil, MD - Primary  The carpal tunnel was tight the nerve was ischemic discolored and flat  Carpal tunnel release left wrist  Preop diagnosis carpal tunnel syndrome left wrist   postop diagnosis same  Procedure open carpal tunnel release left wrist Surgeon Aline Brochure  Anesthesia regional Bier block  Operative findings compression of the left median nerve without any anterior carpal tunnel masses   Indications failure of conservative treatment to relieve pain and paresthesias and numbness and tingling of the left hand  The patient was identified in the preop area we confirm the surgical site marked as left wrist. Chart update completed. Patient taken to surgery. She had 2 g of Ancef. After establishing a Bier block left her arm was prepped with ChloraPrep.  Timeout executed completed and confirmed site.  A straight incision was made over the left carpal tunnel in line with the radial border of the ring finger. Blunt dissection was carried out to find the distal aspect of the carpal tunnel. A blunted instrument was passed beneath the carpal tunnel. Sharp incision was then used to release the transverse carpal ligament. The contents of the carpal tunnel were inspected. The median nerve was compressed with ischemia and discoloration, it was flat   The wound was irrigated and then closed with 3-0 nylon suture. We injected 10 mL of plain Marcaine on the radial side of the incision  A sterile bandage was applied and the tourniquet was released the color of the hand and capillary refill were normal  The patient was taken to the recovery room in stable condition  PHYSICIAN ASSISTANT:    ASSISTANTS: none   ANESTHESIA:   regional  EBL:  none   BLOOD ADMINISTERED:none  DRAINS: none   LOCAL MEDICATIONS USED:  MARCAINE     SPECIMEN:  No Specimen  DISPOSITION OF SPECIMEN:  N/A  COUNTS:  YES  TOURNIQUET:   See anesthesia record   DICTATION: .Dragon Dictation  PLAN OF CARE: Discharge to home after PACU  PATIENT DISPOSITION:  PACU - hemodynamically stable.   Delay start of Pharmacological VTE agent (>24hrs) due to surgical blood loss or risk of bleeding: not applicable

## 2019-04-26 NOTE — Transfer of Care (Signed)
Immediate Anesthesia Transfer of Care Note  Patient: Emily Simmons  Procedure(s) Performed: LEFT CARPAL TUNNEL RELEASE (Left )  Patient Location: PACU  Anesthesia Type:Bier block  Level of Consciousness: awake and patient cooperative  Airway & Oxygen Therapy: Patient Spontanous Breathing and Patient connected to nasal cannula oxygen  Post-op Assessment: Report given to RN, Post -op Vital signs reviewed and stable and Patient moving all extremities  Post vital signs: Reviewed and stable  Last Vitals:  Vitals Value Taken Time  BP    Temp    Pulse    Resp    SpO2      Last Pain:  Vitals:   04/26/19 0746  TempSrc: Oral  PainSc: 8       Patients Stated Pain Goal: 5 (19/75/88 3254)  Complications: No apparent anesthesia complications

## 2019-04-26 NOTE — Anesthesia Postprocedure Evaluation (Signed)
Anesthesia Post Note  Patient: Emily Simmons  Procedure(s) Performed: LEFT CARPAL TUNNEL RELEASE (Left )  Patient location during evaluation: PACU Anesthesia Type: Bier Block Level of consciousness: awake and patient cooperative Pain management: pain level controlled Vital Signs Assessment: post-procedure vital signs reviewed and stable Respiratory status: spontaneous breathing, respiratory function stable and nonlabored ventilation Cardiovascular status: blood pressure returned to baseline Postop Assessment: no apparent nausea or vomiting Anesthetic complications: no     Last Vitals:  Vitals:   04/26/19 0746  Temp: 36.8 C    Last Pain:  Vitals:   04/26/19 0746  TempSrc: Oral  PainSc: 8                  Alexandra Lipps J

## 2019-04-26 NOTE — Brief Op Note (Signed)
04/26/2019  11:00 AM  PATIENT:  Emily Simmons  43 y.o. female  PRE-OPERATIVE DIAGNOSIS:  left carpal tunnel syndrome  POST-OPERATIVE DIAGNOSIS:  left carpal tunnel syndrome  PROCEDURE:  Procedure(s): LEFT CARPAL TUNNEL RELEASE (Left)  SURGEON:  Surgeon(s) and Role:    Carole Civil, MD - Primary  The carpal tunnel was tight the nerve was ischemic discolored and flat  Carpal tunnel release left wrist  Preop diagnosis carpal tunnel syndrome left wrist   postop diagnosis same  Procedure open carpal tunnel release left wrist Surgeon Aline Brochure  Anesthesia regional Bier block  Operative findings compression of the left median nerve without any anterior carpal tunnel masses   Indications failure of conservative treatment to relieve pain and paresthesias and numbness and tingling of the left hand  The patient was identified in the preop area we confirm the surgical site marked as left wrist. Chart update completed. Patient taken to surgery. She had 2 g of Ancef. After establishing a Bier block left her arm was prepped with ChloraPrep.  Timeout executed completed and confirmed site.  A straight incision was made over the left carpal tunnel in line with the radial border of the ring finger. Blunt dissection was carried out to find the distal aspect of the carpal tunnel. A blunted instrument was passed beneath the carpal tunnel. Sharp incision was then used to release the transverse carpal ligament. The contents of the carpal tunnel were inspected. The median nerve was compressed with ischemia and discoloration, it was flat   The wound was irrigated and then closed with 3-0 nylon suture. We injected 10 mL of plain Marcaine on the radial side of the incision  A sterile bandage was applied and the tourniquet was released the color of the hand and capillary refill were normal  The patient was taken to the recovery room in stable condition  PHYSICIAN ASSISTANT:   ASSISTANTS:  none   ANESTHESIA:   regional  EBL:  none   BLOOD ADMINISTERED:none  DRAINS: none   LOCAL MEDICATIONS USED:  MARCAINE     SPECIMEN:  No Specimen  DISPOSITION OF SPECIMEN:  N/A  COUNTS:  YES  TOURNIQUET:   See anesthesia record   DICTATION: .Dragon Dictation  PLAN OF CARE: Discharge to home after PACU  PATIENT DISPOSITION:  PACU - hemodynamically stable.   Delay start of Pharmacological VTE agent (>24hrs) due to surgical blood loss or risk of bleeding: not applicable

## 2019-04-26 NOTE — Anesthesia Procedure Notes (Signed)
Anesthesia Regional Block: Bier block (IV Regional)   Pre-Anesthetic Checklist: ,,, Correct Patient, Correct Site, Correct Laterality, Correct Procedure,, site marked, risks and benefits discussed, surgical consent, pre-op evaluation,  Laterality: Left     Needles:  Injection technique: Single-shot      Additional Needles:   Procedures:,,,,,, Esmarch exsanguination, single tourniquet utilized,  Narrative:  Start time: 04/26/2019 10:26 AM CRNA: Charmaine Downs, CRNA

## 2019-04-26 NOTE — Anesthesia Preprocedure Evaluation (Signed)
Anesthesia Evaluation  Patient identified by MRN, date of birth, ID band Patient awake    Reviewed: Allergy & Precautions, NPO status , Patient's Chart, lab work & pertinent test results  Airway Mallampati: II  TM Distance: >3 FB Neck ROM: Full    Dental no notable dental hx. (+) Teeth Intact   Pulmonary neg pulmonary ROS, Current Smoker,    Pulmonary exam normal breath sounds clear to auscultation       Cardiovascular Exercise Tolerance: Good negative cardio ROS Normal cardiovascular examI Rhythm:Regular Rate:Normal     Neuro/Psych negative neurological ROS  negative psych ROS   GI/Hepatic Neg liver ROS, GERD  Controlled,States only uses OTC-tums or rolaids    Endo/Other  negative endocrine ROSdiabetesDiet controlled -denies current DM meds  Renal/GU negative Renal ROS  negative genitourinary   Musculoskeletal  (+) Arthritis , Osteoarthritis,    Abdominal   Peds negative pediatric ROS (+)  Hematology negative hematology ROS (+)   Anesthesia Other Findings   Reproductive/Obstetrics negative OB ROS                             Anesthesia Physical Anesthesia Plan  ASA: II  Anesthesia Plan: Bier Block and Bier Block-LIDOCAINE ONLY   Post-op Pain Management:    Induction: Intravenous  PONV Risk Score and Plan: 1 and Propofol infusion  Airway Management Planned: Nasal Cannula and Simple Face Mask  Additional Equipment:   Intra-op Plan:   Post-operative Plan:   Informed Consent: I have reviewed the patients History and Physical, chart, labs and discussed the procedure including the risks, benefits and alternatives for the proposed anesthesia with the patient or authorized representative who has indicated his/her understanding and acceptance.     Dental advisory given  Plan Discussed with: CRNA  Anesthesia Plan Comments: (Plan Full PPE use  Plan Bier/MAC as tolerated  Also  gave permission for GA vs GETA as needed)        Anesthesia Quick Evaluation

## 2019-04-26 NOTE — Discharge Instructions (Signed)

## 2019-04-27 ENCOUNTER — Encounter (HOSPITAL_COMMUNITY): Payer: Self-pay | Admitting: Orthopedic Surgery

## 2019-05-01 DIAGNOSIS — Z9889 Other specified postprocedural states: Secondary | ICD-10-CM | POA: Insufficient documentation

## 2019-05-02 ENCOUNTER — Other Ambulatory Visit: Payer: Self-pay | Admitting: Orthopedic Surgery

## 2019-05-02 ENCOUNTER — Other Ambulatory Visit: Payer: Self-pay

## 2019-05-02 ENCOUNTER — Telehealth: Payer: Self-pay

## 2019-05-02 ENCOUNTER — Encounter: Payer: Self-pay | Admitting: Orthopedic Surgery

## 2019-05-02 ENCOUNTER — Ambulatory Visit (INDEPENDENT_AMBULATORY_CARE_PROVIDER_SITE_OTHER): Payer: BC Managed Care – PPO | Admitting: Orthopedic Surgery

## 2019-05-02 DIAGNOSIS — G8918 Other acute postprocedural pain: Secondary | ICD-10-CM

## 2019-05-02 DIAGNOSIS — Z9889 Other specified postprocedural states: Secondary | ICD-10-CM

## 2019-05-02 MED ORDER — HYDROCODONE-ACETAMINOPHEN 5-325 MG PO TABS: 1.0000 | ORAL_TABLET | Freq: Four times a day (QID) | ORAL | 0 refills | Status: DC | PRN

## 2019-05-02 MED ORDER — IBUPROFEN 200 MG PO TABS: 600.0000 mg | ORAL_TABLET | Freq: Four times a day (QID) | ORAL | 5 refills | Status: DC | PRN

## 2019-05-02 NOTE — Telephone Encounter (Signed)
Called patient to advise at this point Dr Aline Brochure only wants her to use the Advil or Tylenol, not the Hydrocodone.  Left message for her to call back so I can advise.

## 2019-05-02 NOTE — Telephone Encounter (Signed)
Patient returned your call and asked for you to give her a call when you can.

## 2019-05-02 NOTE — Progress Notes (Signed)
Emily Simmons is status post left carpal tunnel release   S:   Chief Complaint  Patient presents with  . Routine Post Op    left carpal tunnel release 04/26/19    Doing well.   The patient complains of tips of fingers sore and a little numb  Current pain medication: norco 5   The incision is clean dry and intact with no drainage The dressing was changed.  Patient to return for suture removal ~ POD 17-20

## 2019-05-02 NOTE — Telephone Encounter (Signed)
Emily Simmons was seen in the office earlier today.  She called back and stated that she forgot to ask for more pain medication.  She said that sometimes she has had to take 2 pills instead of 1 and now she is almost out.  She states she uses [facility] in [place]

## 2019-05-02 NOTE — Telephone Encounter (Signed)
Left another message.

## 2019-05-14 ENCOUNTER — Other Ambulatory Visit: Payer: Self-pay

## 2019-05-14 ENCOUNTER — Ambulatory Visit (INDEPENDENT_AMBULATORY_CARE_PROVIDER_SITE_OTHER): Payer: BC Managed Care – PPO | Admitting: Orthopedic Surgery

## 2019-05-14 VITALS — BP 127/87 | HR 102 | Temp 98.0°F | Ht 68.0 in | Wt 232.0 lb

## 2019-05-14 DIAGNOSIS — Z9889 Other specified postprocedural states: Secondary | ICD-10-CM

## 2019-05-14 NOTE — Patient Instructions (Signed)
Work on bending and straightening your fingers and strengthening your hand  Showers are okay    Follow-up in 4 weeks   Try to use ibuprofen and Tylenol for pain and not use opioid medication such as hydrocodone

## 2019-05-14 NOTE — Progress Notes (Signed)
POST OP VISIT   Chief Complaint  Patient presents with  . Follow-up    Recheck on left CTR, DOS 04-26-19.   POD # 18  Encounter Diagnosis  Name Primary?  . S/P carpal tunnel release left 04/26/19 Yes     Current Outpatient Medications:  .  HYDROcodone-acetaminophen (NORCO/VICODIN) 5-325 MG tablet, Take 1 tablet by mouth every 6 (six) hours as needed for moderate pain., Disp: 28 tablet, Rfl: 0 .  ibuprofen (ADVIL) 200 MG tablet, Take 3 tablets (600 mg total) by mouth every 6 (six) hours as needed for moderate pain., Disp: 90 tablet, Rfl: 5  THERE ARE NO COMPLAINTS   THE WOUND LOOKS CLEAN AND THERE ARE NO SIGNS OF ERYTHEMA  Fingertips still numb and stiff swollen   EVERYTHING LOOKS GOOD   START arom exercises   FU WILL BE SCHEDULED FOR 4 weeks

## 2019-05-25 ENCOUNTER — Other Ambulatory Visit: Payer: Self-pay

## 2019-05-25 DIAGNOSIS — G8918 Other acute postprocedural pain: Secondary | ICD-10-CM

## 2019-05-25 NOTE — Telephone Encounter (Signed)
Hydrocodone-Acetaminophen 5/325 mg  Qty 28 Tablets  Take 1 tablet by mouth every 6 (six) hours as needed for moderate pain.  PATIENT USES [facility]   Patient is aware that it may not be done until Monday

## 2019-05-28 ENCOUNTER — Telehealth: Payer: Self-pay

## 2019-05-28 NOTE — Telephone Encounter (Signed)
Emily Simmons will be coming in to see you in the morning, she has no way this afternoon.

## 2019-05-28 NOTE — Telephone Encounter (Signed)
Patient called, left voice message, that she will not have to come in regarding stitch; said "cut it out herself."  Returned call to follow up on message; reached voice mail, left message that clinic staff will call.

## 2019-05-29 ENCOUNTER — Ambulatory Visit: Payer: BC Managed Care – PPO | Admitting: Orthopedic Surgery

## 2019-05-29 NOTE — Telephone Encounter (Signed)
Thanks

## 2019-06-01 ENCOUNTER — Telehealth: Payer: Self-pay | Admitting: Orthopedic Surgery

## 2019-06-01 DIAGNOSIS — G8918 Other acute postprocedural pain: Secondary | ICD-10-CM

## 2019-06-01 NOTE — Telephone Encounter (Signed)
Patient requests refill on Hydrocodone/Acetaminophen 5-325  Mgs. Qty  28  Sig: Take 1 tablet by mouth every 6 (six) hours as needed for moderate pain.  Patient uses [facility]

## 2019-06-05 MED ORDER — HYDROCODONE-ACETAMINOPHEN 5-325 MG PO TABS: 1.0000 | ORAL_TABLET | Freq: Four times a day (QID) | ORAL | 0 refills | Status: DC | PRN

## 2019-06-11 ENCOUNTER — Other Ambulatory Visit: Payer: Self-pay

## 2019-06-11 ENCOUNTER — Telehealth: Payer: Self-pay | Admitting: Orthopedic Surgery

## 2019-06-11 ENCOUNTER — Ambulatory Visit (INDEPENDENT_AMBULATORY_CARE_PROVIDER_SITE_OTHER): Payer: Self-pay | Admitting: Orthopedic Surgery

## 2019-06-11 VITALS — BP 131/82 | HR 89 | Temp 97.2°F | Ht 68.0 in | Wt 233.0 lb

## 2019-06-11 DIAGNOSIS — Z9889 Other specified postprocedural states: Secondary | ICD-10-CM

## 2019-06-11 MED ORDER — HYDROCODONE-ACETAMINOPHEN 5-325 MG PO TABS: 1.0000 | ORAL_TABLET | Freq: Two times a day (BID) | ORAL | 0 refills | Status: AC | PRN

## 2019-06-11 NOTE — Addendum Note (Signed)
Addended by: Carole Civil on: 06/11/2019 04:48 PM   Modules accepted: Orders

## 2019-06-11 NOTE — Progress Notes (Addendum)
Chief Complaint  Patient presents with  . Follow-up    Recheck on left CTR, DOS 04-26-19.    6 weeks post op ctr left   Incision looks good hands are a little bit stiff in terms of making a full fist  From the PIP joint distally thumb index ring still numb  Recommend active range of motion exercises follow-up in 6 weeks  Meds ordered this encounter  Medications  . HYDROcodone-acetaminophen (NORCO/VICODIN) 5-325 MG tablet    Sig: Take 1 tablet by mouth every 12 (twelve) hours as needed for up to 15 days for moderate pain.    Dispense:  30 tablet    Refill:  0

## 2019-06-11 NOTE — Telephone Encounter (Signed)
Patient called back after today's visit to ask for a refill on: HYDROcodone-acetaminophen (NORCO/VICODIN) 5-325 MG tablet 28 tablet  - [employer]

## 2019-06-13 ENCOUNTER — Telehealth: Payer: Self-pay | Admitting: Orthopedic Surgery

## 2019-06-13 NOTE — Telephone Encounter (Signed)
Emily Simmons called this afternoon stating that she now has "a knot on her left hand" near her wrist where she just had surgery.  She said she thinks that she may have overdone it with her exercises.  It is swollen and hurts. She wants to know what she should do about it.  Please advise the patient

## 2019-06-14 NOTE — Telephone Encounter (Signed)
We need to work her in today

## 2019-06-14 NOTE — Telephone Encounter (Signed)
I spoke back to [place].  She said the knot seemed to be going down some.  I asked her if she could come in this afternoon and let Dr. Aline Brochure look at it.  She said she is in [place] but will going back home later today.  She will call the office early tomorrow morning and let us know how it looks.  We can get her in tomorrow morning if she wants to come in.

## 2019-07-23 ENCOUNTER — Encounter: Payer: Self-pay | Admitting: Orthopedic Surgery

## 2019-07-23 ENCOUNTER — Ambulatory Visit (INDEPENDENT_AMBULATORY_CARE_PROVIDER_SITE_OTHER): Payer: Self-pay | Admitting: Orthopedic Surgery

## 2019-07-23 ENCOUNTER — Other Ambulatory Visit: Payer: Self-pay

## 2019-07-23 DIAGNOSIS — G5602 Carpal tunnel syndrome, left upper limb: Secondary | ICD-10-CM

## 2019-07-23 DIAGNOSIS — G8918 Other acute postprocedural pain: Secondary | ICD-10-CM

## 2019-07-23 DIAGNOSIS — Z9889 Other specified postprocedural states: Secondary | ICD-10-CM

## 2019-07-23 MED ORDER — PREDNISONE 10 MG (48) PO TBPK: ORAL_TABLET | Freq: Every day | ORAL | 0 refills | Status: DC

## 2019-07-23 MED ORDER — ACETAMINOPHEN-CODEINE #3 300-30 MG PO TABS: 1.0000 | ORAL_TABLET | Freq: Four times a day (QID) | ORAL | 0 refills | Status: DC | PRN

## 2019-07-23 MED ORDER — GABAPENTIN 100 MG PO CAPS: 100.0000 mg | ORAL_CAPSULE | Freq: Three times a day (TID) | ORAL | 2 refills | Status: AC

## 2019-07-23 NOTE — Progress Notes (Signed)
POST OP APPT  Chief Complaint  Patient presents with  . Routine Post Op    04/26/2019 still has pain palm / hand and problems with fingers/ feels weak still     Current Outpatient Medications:  .  ibuprofen (ADVIL) 200 MG tablet, Take 3 tablets (600 mg total) by mouth every 6 (six) hours as needed for moderate pain., Disp: 90 tablet, Rfl: 5  The incision is healed but the incision is very tender the fingers involving the median nerve are hypersensitive to touch and numb   I rec repeat surgery   shes reluctant   Will call us back   Meds ordered this encounter  Medications  . predniSONE (STERAPRED UNI-PAK 48 TAB) 10 MG (48) TBPK tablet    Sig: Take by mouth daily.    Dispense:  48 tablet    Refill:  0  . gabapentin (NEURONTIN) 100 MG capsule    Sig: Take 1 capsule (100 mg total) by mouth 3 (three) times daily.    Dispense:  90 capsule    Refill:  2  . DISCONTD: acetaminophen-codeine (TYLENOL #3) 300-30 MG tablet    Sig: Take 1 tablet by mouth every 6 (six) hours as needed for moderate pain.    Dispense:  30 tablet    Refill:  0  . acetaminophen-codeine (TYLENOL #3) 300-30 MG tablet    Sig: Take 1 tablet by mouth every 6 (six) hours as needed for moderate pain.    Dispense:  30 tablet    Refill:  0

## 2019-07-23 NOTE — Patient Instructions (Signed)
Recommend re peat surgery   She wants to think about it   Continue ibuprofen and hand exercises

## 2019-07-30 ENCOUNTER — Telehealth: Payer: Self-pay | Admitting: Orthopedic Surgery

## 2019-07-30 NOTE — Telephone Encounter (Signed)
Patient called, relays she has concerns about scheduling the repeat surgery, as discussed at her recent office visit 07/23/19, and requests to speak with Dr Aline Brochure directly. States some concerns are financial-related, as states has no insurance. Patient has been given [place] counselor contact information. Please call patient regarding her other concerns. [phone].

## 2019-07-31 NOTE — Telephone Encounter (Signed)
Send to Abigail Butts if financial

## 2019-07-31 NOTE — Telephone Encounter (Signed)
Routing to [place] per Dr Ruthe Mannan response.

## 2019-08-01 NOTE — Telephone Encounter (Signed)
IC patient and discussed.  She has called Development worker, community and LM. I told her to speak with them, get that established, then call when it is and we can discuss scheduling surgery.

## 2019-08-01 NOTE — Telephone Encounter (Signed)
Lexii called back and states she spoke to Development worker, community.  She was told that she doesn't qualify for assistance at this time.  She was also told that she would not qualify until after she had surgery and the costs exceeded 5000.00  She wants to know what to do now?  Please call her  Thanks

## 2019-08-02 NOTE — Telephone Encounter (Signed)
CTR surgery cost is [phone] for surgeon's fee, and self pay discount will make her cost $600.60.  This does not include the facility/anesthesia fees.   I will call patient and discuss.

## 2019-08-15 NOTE — Telephone Encounter (Signed)
I spoke with patient, she says that the feeling is back in her thumb.  She would like to wait it out and see if it returns in the other fingers.  She will call us when/if needed.

## 2024-02-16 ENCOUNTER — Other Ambulatory Visit: Payer: Self-pay

## 2024-02-16 ENCOUNTER — Emergency Department (HOSPITAL_COMMUNITY)
Admission: EM | Admit: 2024-02-16 | Discharge: 2024-02-16 | Disposition: A | Discharge status: Home / Self Care | Attending: Student | Admitting: Student

## 2024-02-16 ENCOUNTER — Encounter (HOSPITAL_COMMUNITY): Payer: Self-pay

## 2024-02-16 DIAGNOSIS — L02411 Cutaneous abscess of right axilla: Secondary | ICD-10-CM | POA: Diagnosis present

## 2024-02-16 DIAGNOSIS — L732 Hidradenitis suppurativa: Secondary | ICD-10-CM | POA: Insufficient documentation

## 2024-02-16 DIAGNOSIS — E119 Type 2 diabetes mellitus without complications: Secondary | ICD-10-CM | POA: Insufficient documentation

## 2024-02-16 MED ORDER — LIDOCAINE HCL (PF) 1 % IJ SOLN
INTRAMUSCULAR | Status: AC
Start: 2024-02-16 — End: 2024-02-16
  Filled 2024-02-16: qty 10

## 2024-02-16 MED ORDER — POVIDONE-IODINE 10 % EX SOLN
CUTANEOUS | Status: AC
Start: 2024-02-16 — End: 2024-02-16
  Filled 2024-02-16: qty 14.8

## 2024-02-16 MED ORDER — HYDROCODONE-ACETAMINOPHEN 5-325 MG PO TABS
1.0000 | ORAL_TABLET | Freq: Once | ORAL | Status: AC
  Administered 2024-02-16: 1 via ORAL
  Filled 2024-02-16: qty 1

## 2024-02-16 MED ORDER — LIDOCAINE HCL (PF) 1 % IJ SOLN: 30.0000 mL | Freq: Once | INTRAMUSCULAR | Status: AC

## 2024-02-16 MED ORDER — DOXYCYCLINE HYCLATE 100 MG PO TABS
100.0000 mg | ORAL_TABLET | Freq: Once | ORAL | Status: AC
  Administered 2024-02-16: 100 mg via ORAL
  Filled 2024-02-16: qty 1

## 2024-02-16 MED ORDER — DOXYCYCLINE HYCLATE 100 MG PO CAPS: 100.0000 mg | ORAL_CAPSULE | Freq: Two times a day (BID) | ORAL | 0 refills | Status: DC

## 2024-02-16 NOTE — ED Provider Notes (Signed)
 [place] EMERGENCY DEPARTMENT AT [facility] Provider Note   CSN: [id number] Arrival date & time: 02/16/24  1234     History  Chief Complaint  Patient presents with   Abscess    Emily Simmons is a 48 y.o. female.  She has history of hidradenitis suppurativa, here for right axillary abscess ongoing for the past week, she was prescribed Keflex via telehealth and has not had improvement.  She does have history of diabetes, arthritis and GERD.  States her blood sugars been running in the 90s.  Denies constitutional symptoms.  She reports she had cold symptoms yesterday which are now resolved.  He does report that the pain in her right axilla has been keeping her up at night for the past several nights.  Had a very minimal amount of drainage intermittently.  Told the past she needed to have a surgical procedure to deal with this but has not been able to arrange this yet.   Abscess      Home Medications Prior to Admission medications   Medication Sig Start Date End Date Taking? Authorizing Provider  doxycycline  (VIBRAMYCIN ) 100 MG capsule Take 1 capsule (100 mg total) by mouth 2 (two) times daily. 02/16/24  Yes Juanya Villavicencio A, PA-C  acetaminophen -codeine  (TYLENOL  #3) 300-30 MG tablet Take 1 tablet by mouth every 6 (six) hours as needed for moderate pain. 07/23/19   Darrin Emerald, MD  gabapentin  (NEURONTIN ) 100 MG capsule Take 1 capsule (100 mg total) by mouth 3 (three) times daily. 07/23/19   Darrin Emerald, MD  ibuprofen  (ADVIL ) 200 MG tablet Take 3 tablets (600 mg total) by mouth every 6 (six) hours as needed for moderate pain. 05/02/19   Darrin Emerald, MD  predniSONE  (STERAPRED UNI-PAK 48 TAB) 10 MG (48) TBPK tablet Take by mouth daily. 07/23/19   Darrin Emerald, MD      Allergies    Patient has no known allergies.    Review of Systems   Review of Systems  Physical Exam Updated Vital Signs BP (!) 134/91   Pulse 100   Temp 98.5 F (36.9 C)   Resp  20   Ht 5\' 8"  (1.727 m)   Wt 105.7 kg   SpO2 96%   BMI 35.43 kg/m  Physical Exam Vitals and nursing note reviewed.  Constitutional:      General: She is not in acute distress.    Appearance: She is well-developed.  HENT:     Head: Normocephalic and atraumatic.     Mouth/Throat:     Mouth: Mucous membranes are moist.  Eyes:     Conjunctiva/sclera: Conjunctivae normal.  Cardiovascular:     Rate and Rhythm: Normal rate and regular rhythm.     Heart sounds: No murmur heard. Pulmonary:     Effort: Pulmonary effort is normal. No respiratory distress.     Breath sounds: Normal breath sounds.  Abdominal:     Palpations: Abdomen is soft.     Tenderness: There is no abdominal tenderness.  Musculoskeletal:        General: No swelling.     Cervical back: Neck supple.  Skin:    General: Skin is warm and dry.     Capillary Refill: Capillary refill takes less than 2 seconds.  Neurological:     General: No focal deficit present.     Mental Status: She is alert and oriented to person, place, and time.  Psychiatric:  Mood and Affect: Mood normal.     ED Results / Procedures / Treatments   Labs (all labs ordered are listed, but only abnormal results are displayed) Labs Reviewed - No data to display  EKG None  Radiology No results found.  Procedures .Incision and Drainage  Date/Time: 02/16/2024 3:14 PM  Performed by: Aimee Houseman, PA-C Authorized by: Aimee Houseman, PA-C   Consent:    Consent obtained:  Verbal   Consent given by:  Patient   Risks discussed:  Bleeding, incomplete drainage, pain, infection and damage to other organs   Alternatives discussed:  No treatment and referral Universal protocol:    Procedure explained and questions answered to patient or proxy's satisfaction: yes     Patient identity confirmed:  Verbally with patient Location:    Type:  Abscess   Size:  4 cm   Location: Right axilla. Pre-procedure details:    Skin preparation:   Chlorhexidine  Sedation:    Sedation type:  None Anesthesia:    Anesthesia method:  Local infiltration   Local anesthetic:  Lidocaine  1% w/o epi Procedure type:    Complexity:  Complex Procedure details:    Ultrasound guidance: no     Needle aspiration: no     Incision types:  Single straight   Incision depth:  Subcutaneous   Wound management:  Probed and deloculated   Drainage:  Purulent   Drainage amount:  Moderate   Packing materials:  1/2 in iodoform gauze Post-procedure details:    Procedure completion:  Tolerated     Medications Ordered in ED Medications  doxycycline  (VIBRA -TABS) tablet 100 mg (has no administration in time range)  HYDROcodone -acetaminophen  (NORCO/VICODIN) 5-325 MG per tablet 1 tablet (1 tablet Oral Given 02/16/24 1329)  lidocaine  (PF) (XYLOCAINE ) 1 % injection 30 mL ( Infiltration Given 02/16/24 1508)  povidone-iodine  (BETADINE ) 10 % external solution (  Given by Other 02/16/24 1509)    ED Course/ Medical Decision Making/ A&P                                 Medical Decision Making Differential diagnosis includes but not limited to abscess, cellulitis, dermal inclusion cyst, hidradenitis suppurativa, lipoma, other  Course: Patient has known hidradenitis suppurativa, had previously follow-up with dermatology but has not seen them in a long time.  She has had this abscess for the past several days, she having a lot of pain.  Health prescribed her Keflex.  We discussed we will change to Doxy, due to her significant discomfort and very large abscess this was incised and drained and she got significant relief of her pain.  She was given Norco prior to the procedure due to her discomfort, does have a ride home.  This improved her symptoms.  Advised on wound care and follow-up to days for wound check and packing removal.  She was given follow-up with dermatology hidradenitis clinic at [facility].  Advised on strict return precautions.  She is afebrile here, initially  slightly tachycardic but improved with analgesia.  She does not have any systemic symptoms, sugars are well-controlled at home do not feel she needs further workup at this time.  Risk Prescription drug management.           Final Clinical Impression(s) / ED Diagnoses Final diagnoses:  Axillary hidradenitis suppurativa    Rx / DC Orders ED Discharge Orders          Ordered  doxycycline  (VIBRAMYCIN ) 100 MG capsule  2 times daily        02/16/24 [address] PA-C 02/16/24 1517    Karlyn Overman, MD 02/17/24 [phone]

## 2024-02-16 NOTE — ED Triage Notes (Signed)
 Pt arrived via POV c/o on-going abscess in her right axilla. Pt reports having a telehealth visit in the beginning of the week and was prescribed an antibiotic and ointment with minimal relief.

## 2024-02-16 NOTE — Discharge Instructions (Addendum)
 Today for an abscess to your right axilla related to your hidradenitis suppurativa.  Follow-up with your PCP and/or the hidradenitis clinic at [facility].  You can call [phone] to set up appointment at [facility] for the hidradenitis clinic.  Back if you have fever or chills Or other worsening symptoms.  Have a recheck in 2 days to have the packing removed from your abscess.

## 2024-03-02 ENCOUNTER — Encounter (HOSPITAL_COMMUNITY): Payer: Self-pay

## 2024-03-02 ENCOUNTER — Emergency Department (HOSPITAL_COMMUNITY)
Admission: EM | Admit: 2024-03-02 | Discharge: 2024-03-02 | Disposition: A | Discharge status: Home / Self Care | Attending: Emergency Medicine | Admitting: Emergency Medicine

## 2024-03-02 ENCOUNTER — Other Ambulatory Visit: Payer: Self-pay

## 2024-03-02 ENCOUNTER — Emergency Department (HOSPITAL_COMMUNITY)

## 2024-03-02 DIAGNOSIS — R110 Nausea: Secondary | ICD-10-CM | POA: Diagnosis not present

## 2024-03-02 DIAGNOSIS — M542 Cervicalgia: Secondary | ICD-10-CM | POA: Insufficient documentation

## 2024-03-02 DIAGNOSIS — J029 Acute pharyngitis, unspecified: Secondary | ICD-10-CM | POA: Diagnosis not present

## 2024-03-02 DIAGNOSIS — M549 Dorsalgia, unspecified: Secondary | ICD-10-CM | POA: Diagnosis not present

## 2024-03-02 DIAGNOSIS — M25511 Pain in right shoulder: Secondary | ICD-10-CM | POA: Insufficient documentation

## 2024-03-02 LAB — URINALYSIS, ROUTINE W REFLEX MICROSCOPIC
Bilirubin Urine: NEGATIVE
Glucose, UA: NEGATIVE mg/dL
Ketones, ur: NEGATIVE mg/dL
Leukocytes,Ua: NEGATIVE
Nitrite: NEGATIVE
Protein, ur: NEGATIVE mg/dL
Specific Gravity, Urine: 1.01 (ref 1.005–1.030)
pH: 6 (ref 5.0–8.0)

## 2024-03-02 LAB — CBC WITH DIFFERENTIAL/PLATELET
Abs Immature Granulocytes: 0.08 10*3/uL — ABNORMAL HIGH (ref 0.00–0.07)
Basophils Absolute: 0.1 10*3/uL (ref 0.0–0.1)
Basophils Relative: 1 %
Eosinophils Absolute: 0.1 10*3/uL (ref 0.0–0.5)
Eosinophils Relative: 1 %
HCT: 40.8 % (ref 36.0–46.0)
Hemoglobin: 13.1 g/dL (ref 12.0–15.0)
Immature Granulocytes: 1 %
Lymphocytes Relative: 18 %
Lymphs Abs: 2.3 10*3/uL (ref 0.7–4.0)
MCH: 25.9 pg — ABNORMAL LOW (ref 26.0–34.0)
MCHC: 32.1 g/dL (ref 30.0–36.0)
MCV: 80.8 fL (ref 80.0–100.0)
Monocytes Absolute: 0.9 10*3/uL (ref 0.1–1.0)
Monocytes Relative: 7 %
Neutro Abs: 9.4 10*3/uL — ABNORMAL HIGH (ref 1.7–7.7)
Neutrophils Relative %: 72 %
Platelets: 255 10*3/uL (ref 150–400)
RBC: 5.05 MIL/uL (ref 3.87–5.11)
RDW: 14.6 % (ref 11.5–15.5)
WBC: 12.9 10*3/uL — ABNORMAL HIGH (ref 4.0–10.5)
nRBC: 0 % (ref 0.0–0.2)

## 2024-03-02 LAB — BASIC METABOLIC PANEL WITH GFR
Anion gap: 11 (ref 5–15)
BUN: 9 mg/dL (ref 6–20)
CO2: 23 mmol/L (ref 22–32)
Calcium: 9.3 mg/dL (ref 8.9–10.3)
Chloride: 98 mmol/L (ref 98–111)
Creatinine, Ser: 0.64 mg/dL (ref 0.44–1.00)
GFR, Estimated: >60 mL/min (ref >60)
Glucose, Bld: 156 mg/dL — ABNORMAL HIGH (ref 70–99)
Potassium: 3.8 mmol/L (ref 3.5–5.1)
Sodium: 132 mmol/L — ABNORMAL LOW (ref 135–145)

## 2024-03-02 LAB — URINALYSIS, MICROSCOPIC (REFLEX)

## 2024-03-02 MED ORDER — KETOROLAC TROMETHAMINE 15 MG/ML IJ SOLN
15.0000 mg | Freq: Once | INTRAMUSCULAR | Status: AC
  Administered 2024-03-02: 15 mg via INTRAVENOUS
  Filled 2024-03-02: qty 1

## 2024-03-02 MED ORDER — ONDANSETRON HCL 4 MG/2ML IJ SOLN
4.0000 mg | Freq: Once | INTRAMUSCULAR | Status: AC
  Administered 2024-03-02: 4 mg via INTRAVENOUS
  Filled 2024-03-02: qty 2

## 2024-03-02 MED ORDER — METHOCARBAMOL 500 MG PO TABS
500.0000 mg | ORAL_TABLET | Freq: Once | ORAL | Status: AC
  Administered 2024-03-02: 500 mg via ORAL
  Filled 2024-03-02: qty 1

## 2024-03-02 MED ORDER — NAPROXEN 500 MG PO TABS: 500.0000 mg | ORAL_TABLET | Freq: Two times a day (BID) | ORAL | 0 refills | Status: DC

## 2024-03-02 MED ORDER — IOHEXOL 300 MG/ML  SOLN
75.0000 mL | Freq: Once | INTRAMUSCULAR | Status: AC | PRN
  Administered 2024-03-02: 75 mL via INTRAVENOUS

## 2024-03-02 MED ORDER — METHOCARBAMOL 500 MG PO TABS: 500.0000 mg | ORAL_TABLET | Freq: Four times a day (QID) | ORAL | 0 refills | Status: DC | PRN

## 2024-03-02 NOTE — Discharge Instructions (Signed)
 Thankfully all of your testing was unremarkable, I do want you to take the Robaxin  as prescribed and the Naprosyn  as prescribed, there is no signs of infection, I suspect this is the inflammation of the muscles in your neck  Thank you for allowing us  to treat you in the emergency department today.  After reviewing your examination and potential testing that was done it appears that you are safe to go home.  I would like for you to follow-up with your doctor within the next several days, have them obtain your records and follow-up with them to review all potential tests and results from your visit.  If you should develop severe or worsening symptoms return to the emergency department immediately

## 2024-03-02 NOTE — ED Provider Notes (Signed)
 [place] EMERGENCY DEPARTMENT AT [facility] Provider Note   CSN: [id number] Arrival date & time: 03/02/24  1448     History  Chief Complaint  Patient presents with   Neck Pain        Back Pain    Emily Simmons is a 48 y.o. female.  She has PMH of GERD and arthritis.  She presents the ER today complaining of sore throat with difficulty swallowing and neck pain.  She states this started about 3 days ago with posterior neck pain that is now wraparound to the anterior neck and she is feeling like it is both difficult and painful to swallow.  She denies fever or chills.  Denies injury or trauma.  No numbness or tingling.  Also complains of pain in the right trapezius area.  She has no chest pain or shortness of breath, no fevers or chills.  She states she has been unable to eat for the past 2 days due to the discomfort, and feels nauseous due to not being able to eat.   Neck Pain Back Pain      Home Medications Prior to Admission medications   Medication Sig Start Date End Date Taking? Authorizing Provider  methocarbamol  (ROBAXIN ) 500 MG tablet Take 1 tablet (500 mg total) by mouth every 6 (six) hours as needed for muscle spasms. 03/02/24  Yes Dijon Cosens A, PA-C  naproxen  (NAPROSYN ) 500 MG tablet Take 1 tablet (500 mg total) by mouth 2 (two) times daily. 03/02/24  Yes Charmel Pronovost A, PA-C  acetaminophen -codeine  (TYLENOL  #3) 300-30 MG tablet Take 1 tablet by mouth every 6 (six) hours as needed for moderate pain. 07/23/19   Darrin Emerald, MD  doxycycline  (VIBRAMYCIN ) 100 MG capsule Take 1 capsule (100 mg total) by mouth 2 (two) times daily. 02/16/24   Baxter Limber A, PA-C  gabapentin  (NEURONTIN ) 100 MG capsule Take 1 capsule (100 mg total) by mouth 3 (three) times daily. 07/23/19   Darrin Emerald, MD  ibuprofen  (ADVIL ) 200 MG tablet Take 3 tablets (600 mg total) by mouth every 6 (six) hours as needed for moderate pain. 05/02/19   Darrin Emerald, MD   predniSONE  (STERAPRED UNI-PAK 48 TAB) 10 MG (48) TBPK tablet Take by mouth daily. 07/23/19   Darrin Emerald, MD      Allergies    Patient has no known allergies.    Review of Systems   Review of Systems  Musculoskeletal:  Positive for back pain and neck pain.    Physical Exam Updated Vital Signs BP (!) 138/98 (BP Location: Right Arm)   Pulse 84   Temp 99.4 F (37.4 C) (Oral)   Resp 18   Ht 5\' 8"  (1.727 m)   Wt 106.6 kg   SpO2 100%   BMI 35.73 kg/m  Physical Exam Vitals and nursing note reviewed.  Constitutional:      General: She is not in acute distress.    Appearance: She is well-developed.  HENT:     Head: Normocephalic and atraumatic.     Mouth/Throat:     Mouth: Mucous membranes are dry.     Pharynx: Oropharynx is clear. No oropharyngeal exudate or posterior oropharyngeal erythema.  Eyes:     Extraocular Movements: Extraocular movements intact.     Conjunctiva/sclera: Conjunctivae normal.     Pupils: Pupils are equal, round, and reactive to light.  Neck:     Comments: Painful rotation left and right, tenderness bilateral.  Cervical spinous  muscles and neck supple area.  No meningeal signs. Cardiovascular:     Rate and Rhythm: Normal rate and regular rhythm.     Heart sounds: No murmur heard. Pulmonary:     Effort: Pulmonary effort is normal. No respiratory distress.     Breath sounds: Normal breath sounds.  Abdominal:     Palpations: Abdomen is soft.     Tenderness: There is no abdominal tenderness.  Musculoskeletal:        General: No swelling.     Cervical back: Neck supple.  Skin:    General: Skin is warm and dry.     Capillary Refill: Capillary refill takes less than 2 seconds.  Neurological:     General: No focal deficit present.     Mental Status: She is alert and oriented to person, place, and time.  Psychiatric:        Mood and Affect: Mood normal.     ED Results / Procedures / Treatments   Labs (all labs ordered are listed, but only  abnormal results are displayed) Labs Reviewed  CBC WITH DIFFERENTIAL/PLATELET - Abnormal; Notable for the following components:      Result Value   WBC 12.9 (*)    MCH 25.9 (*)    Neutro Abs 9.4 (*)    Abs Immature Granulocytes 0.08 (*)    All other components within normal limits  BASIC METABOLIC PANEL WITH GFR - Abnormal; Notable for the following components:   Sodium 132 (*)    Glucose, Bld 156 (*)    All other components within normal limits  URINALYSIS, ROUTINE W REFLEX MICROSCOPIC    EKG None  Radiology No results found.  Procedures Procedures    Medications Ordered in ED Medications  ketorolac  (TORADOL ) 15 MG/ML injection 15 mg (15 mg Intravenous Given 03/02/24 1729)  ondansetron  (ZOFRAN ) injection 4 mg (4 mg Intravenous Given 03/02/24 1728)  iohexol (OMNIPAQUE) 300 MG/ML solution 75 mL (75 mLs Intravenous Contrast Given 03/02/24 1807)  methocarbamol  (ROBAXIN ) tablet 500 mg (500 mg Oral Given 03/02/24 1827)    ED Course/ Medical Decision Making/ A&P                                 Medical Decision Making This patient presents to the ED for concern of sore throat and neck pain, this involves an extensive number of treatment options, and is a complaint that carries with it a high risk of complications and morbidity.  The differential diagnosis includes muscle strain, tonsillitis, viral pharyngitis, retropharyngeal abscess, peritonsillar abscess, epiglottitis, other   Co morbidities that complicate the patient evaluation :   Back pain, GERD   Additional history obtained:  Additional history obtained from EMR External records from outside source obtained and reviewed including prior notes and labs   Lab Tests:  I Ordered, and personally interpreted labs.  The pertinent results include: CBC-white blood cell slightly elevated at 12.9, BMP shows mild hyponatremia, normal renal function   Imaging Studies ordered:  I ordered imaging studies including CT neck soft  tissue which is pending I independently visualized and interpreted imaging within scope of identifying emergent findings  I agree with the radiologist interpretation      Problem List / ED Course / Critical interventions / Medication management   Patient having posterior neck pain, also having some anterior neck pain with complaining of sore throat and difficulty with swallowing.  She has very reproducible posterior  neck pain but due to the difficulty swallowing and reassuring exam otherwise CT ordered to rule out retropharyngeal abscess, epiglottitis or other acute abnormalities.  Labs show mild leukocytosis.  Patient is afebrile, and she is not tachycardic or meeting other sepsis criteria.  This is likely musculoskeletal pain, will obtain CT to rule out serious pharyngeal infection CT scan is pending, signed out to Dr. Annabell Key pending CT read. I ordered medication including toradol   for pain  Reevaluation of the patient after these medicines showed that the patient improved I have reviewed the patients home medicines and have made adjustments as needed    Amount and/or Complexity of Data Reviewed Labs: ordered. Radiology: ordered.  Risk Prescription drug management.           Final Clinical Impression(s) / ED Diagnoses Final diagnoses:  None    Rx / DC Orders ED Discharge Orders          Ordered    methocarbamol  (ROBAXIN ) 500 MG tablet  Every 6 hours PRN        03/02/24 1828    naproxen  (NAPROSYN ) 500 MG tablet  2 times daily        03/02/24 1828              Joshua Nieves 03/02/24 1904    Early Glisson, MD 03/02/24 2035

## 2024-03-02 NOTE — ED Triage Notes (Signed)
 Pt here due to neck pain and limited ROM as well as back pain that started 3 days ago. Pt also complaining of nausea

## 2024-03-02 NOTE — ED Notes (Signed)
 Pt returned from CT

## 2024-06-26 ENCOUNTER — Emergency Department (HOSPITAL_COMMUNITY): Payer: Self-pay

## 2024-06-26 ENCOUNTER — Other Ambulatory Visit: Payer: Self-pay

## 2024-06-26 ENCOUNTER — Emergency Department (HOSPITAL_COMMUNITY)
Admission: EM | Admit: 2024-06-26 | Discharge: 2024-06-26 | Disposition: A | Discharge status: Home / Self Care | Payer: Self-pay | Attending: Emergency Medicine | Admitting: Emergency Medicine

## 2024-06-26 ENCOUNTER — Encounter (HOSPITAL_COMMUNITY): Payer: Self-pay

## 2024-06-26 DIAGNOSIS — R519 Headache, unspecified: Secondary | ICD-10-CM

## 2024-06-26 DIAGNOSIS — I10 Essential (primary) hypertension: Secondary | ICD-10-CM | POA: Insufficient documentation

## 2024-06-26 MED ORDER — KETOROLAC TROMETHAMINE 60 MG/2ML IM SOLN
60.0000 mg | Freq: Once | INTRAMUSCULAR | Status: AC
  Administered 2024-06-26: 60 mg via INTRAMUSCULAR
  Filled 2024-06-26: qty 2

## 2024-06-26 MED ORDER — MELOXICAM 15 MG PO TBDP: 15.0000 mg | ORAL_TABLET | Freq: Every day | ORAL | 0 refills | Status: AC

## 2024-06-26 NOTE — ED Provider Notes (Signed)
 [place] EMERGENCY DEPARTMENT AT [facility] Provider Note   CSN: [id number] Arrival date & time: 06/26/24  1748     Patient presents with: Headache   Emily Simmons is a 48 y.o. female.  {Add pertinent medical, surgical, social history, OB history to HPI:32947}  Headache  This patient is a 49 year old female presenting with a complaint of a headache.  This started in the last couple of days although she has had some intermittent headache for couple of weeks.  Today she looks like she might have some swelling on the left side of her face, she denies to me that there was any slurred speech or difficulty using her arms or legs.  She denies to me that she has had any numbness or weakness despite telling this to one of the nurses on arrival.  She does report some photosensitivity, no head injuries no concussions, no history of significant diagnosed headache disorders.  The patient states she takes no daily medications at all.  The patient also reports to me that she has had some intermittent left sided blurred vision, not blurred at this time    Prior to Admission medications   Medication Sig Start Date End Date Taking? Authorizing Provider  acetaminophen -codeine  (TYLENOL  #3) 300-30 MG tablet Take 1 tablet by mouth every 6 (six) hours as needed for moderate pain. 07/23/19   Margrette Taft BRAVO, MD  doxycycline  (VIBRAMYCIN ) 100 MG capsule Take 1 capsule (100 mg total) by mouth 2 (two) times daily. 02/16/24   Suellen Cantor A, PA-C  gabapentin  (NEURONTIN ) 100 MG capsule Take 1 capsule (100 mg total) by mouth 3 (three) times daily. 07/23/19   Margrette Taft BRAVO, MD  ibuprofen  (ADVIL ) 200 MG tablet Take 3 tablets (600 mg total) by mouth every 6 (six) hours as needed for moderate pain. 05/02/19   Margrette Taft BRAVO, MD  methocarbamol  (ROBAXIN ) 500 MG tablet Take 1 tablet (500 mg total) by mouth every 6 (six) hours as needed for muscle spasms. 03/02/24   Suellen Cantor A, PA-C  naproxen   (NAPROSYN ) 500 MG tablet Take 1 tablet (500 mg total) by mouth 2 (two) times daily. 03/02/24   Suellen Cantor A, PA-C  predniSONE  (STERAPRED UNI-PAK 48 TAB) 10 MG (48) TBPK tablet Take by mouth daily. 07/23/19   Margrette Taft BRAVO, MD    Allergies: Patient has no known allergies.    Review of Systems  Neurological:  Positive for headaches.  All other systems reviewed and are negative.   Updated Vital Signs BP (!) 147/96   Pulse 94   Temp 99.2 F (37.3 C) (Oral)   Resp 16   Ht 1.727 m (5' 8)   Wt 106.6 kg   SpO2 95%   BMI 35.73 kg/m   Physical Exam Vitals and nursing note reviewed.  Constitutional:      General: She is not in acute distress.    Appearance: She is well-developed.  HENT:     Head: Normocephalic and atraumatic.     Comments: There is a slight swelling of the left maxillary structures and left periorbital tissues but no redness induration or tenderness in these areas, the lid is minimally asymmetrical compared to the right    Right Ear: Tympanic membrane normal.     Left Ear: Tympanic membrane normal.     Nose: Nose normal.     Mouth/Throat:     Pharynx: No oropharyngeal exudate.  Eyes:     General: No scleral icterus.  Right eye: No discharge.        Left eye: No discharge.     Conjunctiva/sclera: Conjunctivae normal.     Pupils: Pupils are equal, round, and reactive to light.  Neck:     Thyroid: No thyromegaly.     Vascular: No JVD.  Cardiovascular:     Rate and Rhythm: Normal rate and regular rhythm.     Heart sounds: Normal heart sounds. No murmur heard.    No friction rub. No gallop.  Pulmonary:     Effort: Pulmonary effort is normal. No respiratory distress.     Breath sounds: Normal breath sounds. No wheezing or rales.  Abdominal:     General: Bowel sounds are normal. There is no distension.     Palpations: Abdomen is soft. There is no mass.     Tenderness: There is no abdominal tenderness.  Musculoskeletal:        General: No  tenderness. Normal range of motion.     Cervical back: Normal range of motion and neck supple.  Lymphadenopathy:     Cervical: No cervical adenopathy.  Skin:    General: Skin is warm and dry.     Findings: No erythema or rash.  Neurological:     Mental Status: She is alert.     Coordination: Coordination normal.     Comments: Speech is clear, cranial nerves III through XII are intact, memory is intact, strength is normal in all 4 extremities including grips and strength at the bilateral thighs, knees and ankles to extention and flexion, sensation is intact to light touch and pinprick in all 4 extremities. Coordination as tested by finger-nose-finger is normal, no limb ataxia. Normal gait, normal reflexes at the patellar tendons bilaterally  Psychiatric:        Behavior: Behavior normal.     (all labs ordered are listed, but only abnormal results are displayed) Labs Reviewed - No data to display  EKG: None  Radiology: No results found.  {Document cardiac monitor, telemetry assessment procedure when appropriate:32947} Procedures   Medications Ordered in the ED - No data to display    {Click here for ABCD2, HEART and other calculators REFRESH Note before signing:1}                              Medical Decision Making Amount and/or Complexity of Data Reviewed Radiology: ordered.    This patient presents to the ED for concern of headache with subtle left-sided facial swelling differential diagnosis includes sinusitis, allergic reaction, edema, intracranial abnormalities    Additional history obtained   Additional history obtained from Electronic Medical Record External records from outside source obtained and reviewed including medical record, patient has had multiple ED visits over time including neck pain to some chronic nature, she has a history of axillary hidradenitis, no recent admissions to the hospital    Imaging Studies ordered:  I ordered imaging studies  including CT scan of the brain I independently visualized and interpreted imaging which showed *** I agree with the radiologist interpretation   Medicines ordered and prescription drug management:  I ordered medication including ***    I have reviewed the patients home medicines and have made adjustments as needed   Problem List / ED Course:  ***   Social Determinants of Health:       {Document critical care time when appropriate  Document review of labs and clinical decision tools ie CHADS2VASC2, etc  Document your independent review of radiology images and any outside records  Document your discussion with family members, caretakers and with consultants  Document social determinants of health affecting pt's care  Document your decision making why or why not admission, treatments were needed:32947:::1}   Final diagnoses:  None    ED Discharge Orders     None

## 2024-06-26 NOTE — Discharge Instructions (Addendum)
 Your CT scan did not show any abnormalities of your brain, there is no tumors there is no cancers there is no aneurysms, no stroke, your blood pressure was slightly elevated at 147/96 and this does need to be rechecked at your doctor's office this week.  I have prescribed a medication called meloxicam  which you can take once a day, thankfully the CT scan did not show any signs of infection, I do want you to take the medication called meloxicam  once a day to help with the headache and you will need to have your family doctor recheck your blood pressure within the next 2 weeks.  Do not take this medication with ibuprofen .

## 2024-06-26 NOTE — ED Triage Notes (Signed)
 Pt arrived via POV c/o a headache X  2 weeks, and reports photosensitivity, and blurry vision. Pt reports her family told her today it sounded like she was slurring her speech. Pt also reports recent left hand numbness. Pt reports left face feels swollen.
